# Patient Record
Sex: Male | Born: 1960 | Race: Black or African American | Hispanic: No
Health system: Southern US, Community
[De-identification: ages and names within clinical notes are randomized; demographics above are authoritative.]

---

## 2021-09-01 ENCOUNTER — Emergency Department (HOSPITAL_COMMUNITY): Payer: 59

## 2021-09-01 ENCOUNTER — Inpatient Hospital Stay (HOSPITAL_COMMUNITY): Payer: 59

## 2021-09-01 ENCOUNTER — Inpatient Hospital Stay (HOSPITAL_COMMUNITY)
Admission: EM | Admit: 2021-09-01 | Discharge: 2021-10-02 | DRG: 239 | Disposition: E | Payer: 59 | Attending: Pulmonary Disease | Admitting: Pulmonary Disease

## 2021-09-01 DIAGNOSIS — J9602 Acute respiratory failure with hypercapnia: Secondary | ICD-10-CM | POA: Diagnosis present

## 2021-09-01 DIAGNOSIS — I5021 Acute systolic (congestive) heart failure: Secondary | ICD-10-CM | POA: Diagnosis not present

## 2021-09-01 DIAGNOSIS — R509 Fever, unspecified: Secondary | ICD-10-CM

## 2021-09-01 DIAGNOSIS — I5023 Acute on chronic systolic (congestive) heart failure: Secondary | ICD-10-CM | POA: Diagnosis present

## 2021-09-01 DIAGNOSIS — Y901 Blood alcohol level of 20-39 mg/100 ml: Secondary | ICD-10-CM | POA: Diagnosis present

## 2021-09-01 DIAGNOSIS — G931 Anoxic brain damage, not elsewhere classified: Secondary | ICD-10-CM | POA: Diagnosis not present

## 2021-09-01 DIAGNOSIS — F1010 Alcohol abuse, uncomplicated: Secondary | ICD-10-CM | POA: Diagnosis present

## 2021-09-01 DIAGNOSIS — I110 Hypertensive heart disease with heart failure: Secondary | ICD-10-CM | POA: Diagnosis present

## 2021-09-01 DIAGNOSIS — D649 Anemia, unspecified: Secondary | ICD-10-CM | POA: Diagnosis present

## 2021-09-01 DIAGNOSIS — I4901 Ventricular fibrillation: Principal | ICD-10-CM | POA: Diagnosis present

## 2021-09-01 DIAGNOSIS — E669 Obesity, unspecified: Secondary | ICD-10-CM | POA: Diagnosis present

## 2021-09-01 DIAGNOSIS — J810 Acute pulmonary edema: Secondary | ICD-10-CM | POA: Diagnosis not present

## 2021-09-01 DIAGNOSIS — Z20822 Contact with and (suspected) exposure to covid-19: Secondary | ICD-10-CM | POA: Diagnosis present

## 2021-09-01 DIAGNOSIS — I70221 Atherosclerosis of native arteries of extremities with rest pain, right leg: Secondary | ICD-10-CM | POA: Diagnosis not present

## 2021-09-01 DIAGNOSIS — Z789 Other specified health status: Secondary | ICD-10-CM

## 2021-09-01 DIAGNOSIS — I214 Non-ST elevation (NSTEMI) myocardial infarction: Secondary | ICD-10-CM | POA: Diagnosis present

## 2021-09-01 DIAGNOSIS — R339 Retention of urine, unspecified: Secondary | ICD-10-CM | POA: Diagnosis not present

## 2021-09-01 DIAGNOSIS — R739 Hyperglycemia, unspecified: Secondary | ICD-10-CM | POA: Diagnosis not present

## 2021-09-01 DIAGNOSIS — I462 Cardiac arrest due to underlying cardiac condition: Secondary | ICD-10-CM | POA: Diagnosis present

## 2021-09-01 DIAGNOSIS — G9341 Metabolic encephalopathy: Secondary | ICD-10-CM | POA: Diagnosis present

## 2021-09-01 DIAGNOSIS — Z79899 Other long term (current) drug therapy: Secondary | ICD-10-CM

## 2021-09-01 DIAGNOSIS — R579 Shock, unspecified: Secondary | ICD-10-CM | POA: Diagnosis not present

## 2021-09-01 DIAGNOSIS — Y92410 Unspecified street and highway as the place of occurrence of the external cause: Secondary | ICD-10-CM

## 2021-09-01 DIAGNOSIS — J690 Pneumonitis due to inhalation of food and vomit: Secondary | ICD-10-CM | POA: Diagnosis not present

## 2021-09-01 DIAGNOSIS — K567 Ileus, unspecified: Secondary | ICD-10-CM | POA: Diagnosis not present

## 2021-09-01 DIAGNOSIS — J9601 Acute respiratory failure with hypoxia: Secondary | ICD-10-CM | POA: Diagnosis present

## 2021-09-01 DIAGNOSIS — M96A1 Fracture of sternum associated with chest compression and cardiopulmonary resuscitation: Secondary | ICD-10-CM | POA: Diagnosis present

## 2021-09-01 DIAGNOSIS — E872 Acidosis, unspecified: Secondary | ICD-10-CM | POA: Diagnosis present

## 2021-09-01 DIAGNOSIS — Y95 Nosocomial condition: Secondary | ICD-10-CM | POA: Diagnosis not present

## 2021-09-01 DIAGNOSIS — F17210 Nicotine dependence, cigarettes, uncomplicated: Secondary | ICD-10-CM | POA: Diagnosis present

## 2021-09-01 DIAGNOSIS — Z96643 Presence of artificial hip joint, bilateral: Secondary | ICD-10-CM | POA: Diagnosis present

## 2021-09-01 DIAGNOSIS — D696 Thrombocytopenia, unspecified: Secondary | ICD-10-CM | POA: Diagnosis present

## 2021-09-01 DIAGNOSIS — Z6833 Body mass index (BMI) 33.0-33.9, adult: Secondary | ICD-10-CM

## 2021-09-01 DIAGNOSIS — R4182 Altered mental status, unspecified: Secondary | ICD-10-CM | POA: Diagnosis not present

## 2021-09-01 DIAGNOSIS — I2510 Atherosclerotic heart disease of native coronary artery without angina pectoris: Secondary | ICD-10-CM | POA: Diagnosis not present

## 2021-09-01 DIAGNOSIS — Z515 Encounter for palliative care: Secondary | ICD-10-CM | POA: Diagnosis not present

## 2021-09-01 DIAGNOSIS — I513 Intracardiac thrombosis, not elsewhere classified: Secondary | ICD-10-CM | POA: Diagnosis present

## 2021-09-01 DIAGNOSIS — I743 Embolism and thrombosis of arteries of the lower extremities: Secondary | ICD-10-CM | POA: Diagnosis not present

## 2021-09-01 DIAGNOSIS — M96A3 Multiple fractures of ribs associated with chest compression and cardiopulmonary resuscitation: Secondary | ICD-10-CM | POA: Diagnosis present

## 2021-09-01 DIAGNOSIS — N170 Acute kidney failure with tubular necrosis: Secondary | ICD-10-CM | POA: Diagnosis not present

## 2021-09-01 DIAGNOSIS — E876 Hypokalemia: Secondary | ICD-10-CM | POA: Diagnosis not present

## 2021-09-01 DIAGNOSIS — Z452 Encounter for adjustment and management of vascular access device: Secondary | ICD-10-CM

## 2021-09-01 DIAGNOSIS — I255 Ischemic cardiomyopathy: Secondary | ICD-10-CM | POA: Diagnosis present

## 2021-09-01 DIAGNOSIS — F29 Unspecified psychosis not due to a substance or known physiological condition: Secondary | ICD-10-CM | POA: Diagnosis not present

## 2021-09-01 DIAGNOSIS — I428 Other cardiomyopathies: Secondary | ICD-10-CM | POA: Diagnosis present

## 2021-09-01 DIAGNOSIS — K219 Gastro-esophageal reflux disease without esophagitis: Secondary | ICD-10-CM | POA: Diagnosis present

## 2021-09-01 DIAGNOSIS — W2209XA Striking against other stationary object, initial encounter: Secondary | ICD-10-CM | POA: Diagnosis present

## 2021-09-01 DIAGNOSIS — R570 Cardiogenic shock: Secondary | ICD-10-CM | POA: Diagnosis not present

## 2021-09-01 DIAGNOSIS — F05 Delirium due to known physiological condition: Secondary | ICD-10-CM | POA: Diagnosis not present

## 2021-09-01 DIAGNOSIS — I493 Ventricular premature depolarization: Secondary | ICD-10-CM | POA: Diagnosis present

## 2021-09-01 DIAGNOSIS — J9690 Respiratory failure, unspecified, unspecified whether with hypoxia or hypercapnia: Secondary | ICD-10-CM

## 2021-09-01 DIAGNOSIS — S42025A Nondisplaced fracture of shaft of left clavicle, initial encounter for closed fracture: Secondary | ICD-10-CM | POA: Diagnosis present

## 2021-09-01 DIAGNOSIS — I469 Cardiac arrest, cause unspecified: Secondary | ICD-10-CM

## 2021-09-01 DIAGNOSIS — Z8673 Personal history of transient ischemic attack (TIA), and cerebral infarction without residual deficits: Secondary | ICD-10-CM

## 2021-09-01 DIAGNOSIS — T1490XA Injury, unspecified, initial encounter: Secondary | ICD-10-CM

## 2021-09-01 DIAGNOSIS — H5704 Mydriasis: Secondary | ICD-10-CM | POA: Diagnosis present

## 2021-09-01 DIAGNOSIS — S2249XA Multiple fractures of ribs, unspecified side, initial encounter for closed fracture: Secondary | ICD-10-CM

## 2021-09-01 DIAGNOSIS — I472 Ventricular tachycardia, unspecified: Secondary | ICD-10-CM | POA: Diagnosis present

## 2021-09-01 DIAGNOSIS — Z95828 Presence of other vascular implants and grafts: Secondary | ICD-10-CM

## 2021-09-01 DIAGNOSIS — Z978 Presence of other specified devices: Secondary | ICD-10-CM

## 2021-09-01 DIAGNOSIS — E875 Hyperkalemia: Secondary | ICD-10-CM | POA: Diagnosis present

## 2021-09-01 DIAGNOSIS — T148XXA Other injury of unspecified body region, initial encounter: Secondary | ICD-10-CM

## 2021-09-01 DIAGNOSIS — J189 Pneumonia, unspecified organism: Secondary | ICD-10-CM | POA: Diagnosis not present

## 2021-09-01 DIAGNOSIS — I5043 Acute on chronic combined systolic (congestive) and diastolic (congestive) heart failure: Secondary | ICD-10-CM | POA: Diagnosis not present

## 2021-09-01 DIAGNOSIS — Z9911 Dependence on respirator [ventilator] status: Secondary | ICD-10-CM | POA: Diagnosis not present

## 2021-09-01 DIAGNOSIS — Z781 Physical restraint status: Secondary | ICD-10-CM

## 2021-09-01 LAB — I-STAT ARTERIAL BLOOD GAS, ED
Acid-base deficit: 12 mmol/L — ABNORMAL HIGH (ref 0.0–2.0)
Acid-base deficit: 7 mmol/L — ABNORMAL HIGH (ref 0.0–2.0)
Bicarbonate: 20.6 mmol/L (ref 20.0–28.0)
Bicarbonate: 20.1 mmol/L (ref 20.0–28.0)
Calcium, Ion: 1.2 mmol/L (ref 1.15–1.40)
Calcium, Ion: 1.19 mmol/L (ref 1.15–1.40)
HCT: 53 % — ABNORMAL HIGH (ref 39.0–52.0)
HCT: 57 % — ABNORMAL HIGH (ref 39.0–52.0)
Hemoglobin: 18 g/dL — ABNORMAL HIGH (ref 13.0–17.0)
Hemoglobin: 19.4 g/dL — ABNORMAL HIGH (ref 13.0–17.0)
O2 Saturation: 82 %
O2 Saturation: 90 %
Patient temperature: 98.6
Potassium: 3.8 mmol/L (ref 3.5–5.1)
Potassium: 4.3 mmol/L (ref 3.5–5.1)
Sodium: 140 mmol/L (ref 135–145)
Sodium: 141 mmol/L (ref 135–145)
TCO2: 23 mmol/L (ref 22–32)
TCO2: 22 mmol/L (ref 22–32)
pCO2 arterial: 72.6 mmHg (ref 32.0–48.0)
pCO2 arterial: 45.5 mmHg (ref 32.0–48.0)
pH, Arterial: 7.062 — CL (ref 7.350–7.450)
pH, Arterial: 7.254 — ABNORMAL LOW (ref 7.350–7.450)
pO2, Arterial: 66 mmHg — ABNORMAL LOW (ref 83.0–108.0)
pO2, Arterial: 69 mmHg — ABNORMAL LOW (ref 83.0–108.0)

## 2021-09-01 LAB — COMPREHENSIVE METABOLIC PANEL
ALT: 43 U/L (ref 0–44)
AST: 74 U/L — ABNORMAL HIGH (ref 15–41)
Albumin: 3.5 g/dL (ref 3.5–5.0)
Alkaline Phosphatase: 99 U/L (ref 38–126)
Anion gap: 15 (ref 5–15)
BUN: 9 mg/dL (ref 8–23)
CO2: 19 mmol/L — ABNORMAL LOW (ref 22–32)
Calcium: 8.9 mg/dL (ref 8.9–10.3)
Chloride: 107 mmol/L (ref 98–111)
Creatinine, Ser: 1.51 mg/dL — ABNORMAL HIGH (ref 0.61–1.24)
GFR, Estimated: 52 mL/min — ABNORMAL LOW (ref >60)
Glucose, Bld: 198 mg/dL — ABNORMAL HIGH (ref 70–99)
Potassium: 3.1 mmol/L — ABNORMAL LOW (ref 3.5–5.1)
Sodium: 141 mmol/L (ref 135–145)
Total Bilirubin: 0.9 mg/dL (ref 0.3–1.2)
Total Protein: 7.4 g/dL (ref 6.5–8.1)

## 2021-09-01 LAB — URINALYSIS, ROUTINE W REFLEX MICROSCOPIC
Bilirubin Urine: NEGATIVE
Glucose, UA: 250 mg/dL — AB
Ketones, ur: NEGATIVE mg/dL
Leukocytes,Ua: NEGATIVE
Nitrite: NEGATIVE
Protein, ur: >300 mg/dL — AB
Specific Gravity, Urine: 1.02 (ref 1.005–1.030)
pH: 7.5 (ref 5.0–8.0)

## 2021-09-01 LAB — CBC
HCT: 55.1 % — ABNORMAL HIGH (ref 39.0–52.0)
Hemoglobin: 17.5 g/dL — ABNORMAL HIGH (ref 13.0–17.0)
MCH: 33.8 pg (ref 26.0–34.0)
MCHC: 31.8 g/dL (ref 30.0–36.0)
MCV: 106.6 fL — ABNORMAL HIGH (ref 80.0–100.0)
Platelets: 202 10*3/uL (ref 150–400)
RBC: 5.17 MIL/uL (ref 4.22–5.81)
RDW: 14.1 % (ref 11.5–15.5)
WBC: 17.1 10*3/uL — ABNORMAL HIGH (ref 4.0–10.5)
nRBC: 0.4 % — ABNORMAL HIGH (ref 0.0–0.2)

## 2021-09-01 LAB — URINALYSIS, MICROSCOPIC (REFLEX)

## 2021-09-01 LAB — LACTIC ACID, PLASMA
Lactic Acid, Venous: 8.5 mmol/L (ref 0.5–1.9)
Lactic Acid, Venous: 5 mmol/L (ref 0.5–1.9)
Lactic Acid, Venous: 5.1 mmol/L (ref 0.5–1.9)

## 2021-09-01 LAB — CBG MONITORING, ED
Glucose-Capillary: 203 mg/dL — ABNORMAL HIGH (ref 70–99)
Glucose-Capillary: 122 mg/dL — ABNORMAL HIGH (ref 70–99)

## 2021-09-01 LAB — POCT I-STAT 7, (LYTES, BLD GAS, ICA,H+H)
Acid-base deficit: 7 mmol/L — ABNORMAL HIGH (ref 0.0–2.0)
Bicarbonate: 18 mmol/L — ABNORMAL LOW (ref 20.0–28.0)
Calcium, Ion: 1.11 mmol/L — ABNORMAL LOW (ref 1.15–1.40)
HCT: 56 % — ABNORMAL HIGH (ref 39.0–52.0)
Hemoglobin: 19 g/dL — ABNORMAL HIGH (ref 13.0–17.0)
O2 Saturation: 99 %
Patient temperature: 39.6
Potassium: 4.5 mmol/L (ref 3.5–5.1)
Sodium: 141 mmol/L (ref 135–145)
TCO2: 19 mmol/L — ABNORMAL LOW (ref 22–32)
pCO2 arterial: 37.3 mmHg (ref 32.0–48.0)
pH, Arterial: 7.303 — ABNORMAL LOW (ref 7.350–7.450)
pO2, Arterial: 162 mmHg — ABNORMAL HIGH (ref 83.0–108.0)

## 2021-09-01 LAB — PROCALCITONIN: Procalcitonin: 3.08 ng/mL

## 2021-09-01 LAB — GLUCOSE, CAPILLARY
Glucose-Capillary: 88 mg/dL (ref 70–99)
Glucose-Capillary: 82 mg/dL (ref 70–99)
Glucose-Capillary: 80 mg/dL (ref 70–99)

## 2021-09-01 LAB — I-STAT CHEM 8, ED
BUN: 14 mg/dL (ref 8–23)
Calcium, Ion: 1.09 mmol/L — ABNORMAL LOW (ref 1.15–1.40)
Chloride: 106 mmol/L (ref 98–111)
Creatinine, Ser: 1.4 mg/dL — ABNORMAL HIGH (ref 0.61–1.24)
Glucose, Bld: 193 mg/dL — ABNORMAL HIGH (ref 70–99)
HCT: 57 % — ABNORMAL HIGH (ref 39.0–52.0)
Hemoglobin: 19.4 g/dL — ABNORMAL HIGH (ref 13.0–17.0)
Potassium: 3.5 mmol/L (ref 3.5–5.1)
Sodium: 143 mmol/L (ref 135–145)
TCO2: 23 mmol/L (ref 22–32)

## 2021-09-01 LAB — SAMPLE TO BLOOD BANK

## 2021-09-01 LAB — RESP PANEL BY RT-PCR (FLU A&B, COVID) ARPGX2
Influenza A by PCR: NEGATIVE
Influenza B by PCR: NEGATIVE
SARS Coronavirus 2 by RT PCR: NEGATIVE

## 2021-09-01 LAB — TROPONIN I (HIGH SENSITIVITY)
Troponin I (High Sensitivity): 129 ng/L (ref <18)
Troponin I (High Sensitivity): 2835 ng/L (ref <18)

## 2021-09-01 LAB — APTT: aPTT: 25 seconds (ref 24–36)

## 2021-09-01 LAB — PROTIME-INR
INR: 1.1 (ref 0.8–1.2)
INR: 1.1 (ref 0.8–1.2)
Prothrombin Time: 13.8 seconds (ref 11.4–15.2)
Prothrombin Time: 13.9 seconds (ref 11.4–15.2)

## 2021-09-01 LAB — ETHANOL: Alcohol, Ethyl (B): 32 mg/dL — ABNORMAL HIGH (ref <10)

## 2021-09-01 LAB — MRSA NEXT GEN BY PCR, NASAL: MRSA by PCR Next Gen: NOT DETECTED

## 2021-09-01 LAB — CORTISOL: Cortisol, Plasma: 17.7 ug/dL

## 2021-09-01 LAB — HEMOGLOBIN A1C
Hgb A1c MFr Bld: 5.3 % (ref 4.8–5.6)
Mean Plasma Glucose: 105.41 mg/dL

## 2021-09-01 LAB — HIV ANTIBODY (ROUTINE TESTING W REFLEX): HIV Screen 4th Generation wRfx: NONREACTIVE

## 2021-09-01 MED ORDER — ROCURONIUM BROMIDE 50 MG/5ML IV SOLN
INTRAVENOUS | Status: DC | PRN
  Administered 2021-09-01: 80 mg via INTRAVENOUS

## 2021-09-01 MED ORDER — CHLORHEXIDINE GLUCONATE CLOTH 2 % EX PADS
6.0000 | MEDICATED_PAD | Freq: Every day | CUTANEOUS | Status: DC
  Administered 2021-09-02 – 2021-09-14 (×12): 6 via TOPICAL

## 2021-09-01 MED ORDER — PANTOPRAZOLE SODIUM 40 MG IV SOLR: 40.0000 mg | Freq: Every day | INTRAVENOUS | Status: DC

## 2021-09-01 MED ORDER — PANTOPRAZOLE SODIUM 40 MG IV SOLR
40.0000 mg | Freq: Every day | INTRAVENOUS | Status: DC
  Administered 2021-09-01: 40 mg via INTRAVENOUS
  Filled 2021-09-01: qty 40

## 2021-09-01 MED ORDER — SODIUM CHLORIDE 0.9 % IV SOLN
250.0000 mL | INTRAVENOUS | Status: DC
  Administered 2021-09-11: 250 mL via INTRAVENOUS

## 2021-09-01 MED ORDER — FENTANYL 2500MCG IN NS 250ML (10MCG/ML) PREMIX INFUSION
0.0000 ug/h | INTRAVENOUS | Status: DC
  Administered 2021-09-01: 16:00:00 50 ug/h via INTRAVENOUS
  Administered 2021-09-02: 22:00:00 150 ug/h via INTRAVENOUS
  Administered 2021-09-03: 100 ug/h via INTRAVENOUS
  Administered 2021-09-04: 150 ug/h via INTRAVENOUS
  Administered 2021-09-05: 11:00:00 75 ug/h via INTRAVENOUS
  Administered 2021-09-06: 100 ug/h via INTRAVENOUS
  Administered 2021-09-07: 200 ug/h via INTRAVENOUS
  Administered 2021-09-07: 150 ug/h via INTRAVENOUS
  Administered 2021-09-08: 225 ug/h via INTRAVENOUS
  Administered 2021-09-08: 13:00:00 275 ug/h via INTRAVENOUS
  Administered 2021-09-09: 03:00:00 150 ug/h via INTRAVENOUS
  Administered 2021-09-09 – 2021-09-11 (×4): 200 ug/h via INTRAVENOUS
  Administered 2021-09-11: 14:00:00 100 ug/h via INTRAVENOUS
  Administered 2021-09-12: 50 ug/h via INTRAVENOUS
  Administered 2021-09-14: 09:00:00 400 ug/h via INTRAVENOUS
  Filled 2021-09-01 (×18): qty 250

## 2021-09-01 MED ORDER — SODIUM CHLORIDE 0.9 % IV SOLN: INTRAVENOUS | Status: DC

## 2021-09-01 MED ORDER — NOREPINEPHRINE 4 MG/250ML-% IV SOLN
0.0000 ug/min | INTRAVENOUS | Status: DC
  Administered 2021-09-01: 20:00:00 16 ug/min via INTRAVENOUS
  Administered 2021-09-01: 30 ug/min via INTRAVENOUS
  Administered 2021-09-02: 20:00:00 21 ug/min via INTRAVENOUS
  Administered 2021-09-02: 07:00:00 22 ug/min via INTRAVENOUS
  Administered 2021-09-02: 20 ug/min via INTRAVENOUS
  Administered 2021-09-02: 24 ug/min via INTRAVENOUS
  Administered 2021-09-02: 26 ug/min via INTRAVENOUS
  Administered 2021-09-03 (×2): 20 ug/min via INTRAVENOUS
  Administered 2021-09-03: 18 ug/min via INTRAVENOUS
  Administered 2021-09-03: 12 ug/min via INTRAVENOUS
  Administered 2021-09-03: 03:00:00 20 ug/min via INTRAVENOUS
  Administered 2021-09-03: 16:00:00 17 ug/min via INTRAVENOUS
  Administered 2021-09-04: 03:00:00 8 ug/min via INTRAVENOUS
  Filled 2021-09-01 (×16): qty 250

## 2021-09-01 MED ORDER — DOCUSATE SODIUM 50 MG/5ML PO LIQD
100.0000 mg | Freq: Two times a day (BID) | ORAL | Status: DC | PRN
  Filled 2021-09-01: qty 10

## 2021-09-01 MED ORDER — ETOMIDATE 2 MG/ML IV SOLN
INTRAVENOUS | Status: DC | PRN
  Administered 2021-09-01: 20 mg via INTRAVENOUS

## 2021-09-01 MED ORDER — ACETAMINOPHEN 650 MG RE SUPP
650.0000 mg | RECTAL | Status: DC | PRN
  Administered 2021-09-01: 650 mg via RECTAL

## 2021-09-01 MED ORDER — EPINEPHRINE 1 MG/10ML IJ SOSY
PREFILLED_SYRINGE | INTRAMUSCULAR | Status: DC | PRN
  Administered 2021-09-01: 1 mg via INTRAVENOUS

## 2021-09-01 MED ORDER — ALBUTEROL SULFATE (2.5 MG/3ML) 0.083% IN NEBU
2.5000 mg | INHALATION_SOLUTION | RESPIRATORY_TRACT | Status: DC | PRN
  Administered 2021-09-08: 2.5 mg via RESPIRATORY_TRACT
  Filled 2021-09-01 (×2): qty 3

## 2021-09-01 MED ORDER — INSULIN ASPART 100 UNIT/ML IJ SOLN
0.0000 [IU] | INTRAMUSCULAR | Status: DC
  Administered 2021-09-01 – 2021-09-09 (×22): 2 [IU] via SUBCUTANEOUS
  Administered 2021-09-09: 3 [IU] via SUBCUTANEOUS
  Administered 2021-09-10 – 2021-09-13 (×9): 2 [IU] via SUBCUTANEOUS

## 2021-09-01 MED ORDER — SODIUM BICARBONATE 8.4 % IV SOLN
50.0000 meq | Freq: Once | INTRAVENOUS | Status: AC
  Administered 2021-09-01: 50 meq via INTRAVENOUS

## 2021-09-01 MED ORDER — ORAL CARE MOUTH RINSE
15.0000 mL | OROMUCOSAL | Status: DC
  Administered 2021-09-01 – 2021-09-13 (×118): 15 mL via OROMUCOSAL

## 2021-09-01 MED ORDER — POLYETHYLENE GLYCOL 3350 17 G PO PACK: 17.0000 g | PACK | Freq: Every day | ORAL | Status: DC | PRN

## 2021-09-01 MED ORDER — CHLORHEXIDINE GLUCONATE 0.12% ORAL RINSE (MEDLINE KIT)
15.0000 mL | Freq: Two times a day (BID) | OROMUCOSAL | Status: DC
  Administered 2021-09-01 – 2021-09-13 (×24): 15 mL via OROMUCOSAL

## 2021-09-01 MED ORDER — PHENYLEPHRINE 40 MCG/ML (10ML) SYRINGE FOR IV PUSH (FOR BLOOD PRESSURE SUPPORT)
PREFILLED_SYRINGE | INTRAVENOUS | Status: AC
  Administered 2021-09-01: 400 ug
  Filled 2021-09-01: qty 10

## 2021-09-01 MED ORDER — SODIUM CHLORIDE 0.9 % IV BOLUS
1000.0000 mL | Freq: Once | INTRAVENOUS | Status: AC
  Administered 2021-09-01: 1000 mL via INTRAVENOUS

## 2021-09-01 MED ORDER — IOHEXOL 350 MG/ML SOLN
100.0000 mL | Freq: Once | INTRAVENOUS | Status: AC | PRN
  Administered 2021-09-01: 100 mL via INTRAVENOUS

## 2021-09-01 MED ORDER — NOREPINEPHRINE 4 MG/250ML-% IV SOLN
2.0000 ug/min | INTRAVENOUS | Status: DC
  Administered 2021-09-01: 16 ug/min via INTRAVENOUS

## 2021-09-01 MED ORDER — SODIUM CHLORIDE 0.9 % IV SOLN
3.0000 g | Freq: Once | INTRAVENOUS | Status: AC
  Administered 2021-09-01: 3 g via INTRAVENOUS
  Filled 2021-09-01: qty 8

## 2021-09-01 MED ORDER — SODIUM CHLORIDE 0.9 % IV SOLN
3.0000 g | Freq: Four times a day (QID) | INTRAVENOUS | Status: AC
  Administered 2021-09-01 – 2021-09-05 (×18): 3 g via INTRAVENOUS
  Filled 2021-09-01 (×19): qty 8

## 2021-09-01 MED ORDER — PROPOFOL 1000 MG/100ML IV EMUL
5.0000 ug/kg/min | INTRAVENOUS | Status: DC
  Administered 2021-09-01: 30 ug/kg/min via INTRAVENOUS
  Administered 2021-09-01: 40 ug/kg/min via INTRAVENOUS
  Administered 2021-09-01: 10 ug/kg/min via INTRAVENOUS
  Administered 2021-09-02: 40 ug/kg/min via INTRAVENOUS
  Administered 2021-09-02: 20 ug/kg/min via INTRAVENOUS
  Administered 2021-09-02: 30 ug/kg/min via INTRAVENOUS
  Administered 2021-09-02: 40 ug/kg/min via INTRAVENOUS
  Filled 2021-09-01 (×7): qty 100

## 2021-09-01 MED ORDER — METOPROLOL TARTRATE 5 MG/5ML IV SOLN
2.5000 mg | Freq: Four times a day (QID) | INTRAVENOUS | Status: DC
Start: 2021-09-01 — End: 2021-09-02
  Administered 2021-09-01: 5 mg via INTRAVENOUS
  Filled 2021-09-01: qty 5

## 2021-09-01 MED ORDER — NOREPINEPHRINE 4 MG/250ML-% IV SOLN
INTRAVENOUS | Status: AC
  Filled 2021-09-01: qty 250

## 2021-09-01 MED ORDER — DOCUSATE SODIUM 100 MG PO CAPS: 100.0000 mg | ORAL_CAPSULE | Freq: Two times a day (BID) | ORAL | Status: DC | PRN

## 2021-09-01 MED ORDER — SODIUM BICARBONATE 8.4 % IV SOLN
INTRAVENOUS | Status: AC
  Filled 2021-09-01: qty 50
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
## 2021-09-01 NOTE — Progress Notes (Addendum)
Called to bedside  Hypotension with systolic in the 50s  Received bolus of saline Amp of bicarb Neo-Synephrine 200 Started on Levophed  Central line placed in right IJ  Placed order for echocardiogram stat to rule out thrombus, amended earlier order   No seizures on EEG, diffuse encephalopathy.  Additional critical care time spent evaluating and stabilizing patient

## 2021-09-01 NOTE — ED Notes (Signed)
C-collar placed.

## 2021-09-01 NOTE — Procedures (Signed)
Central Venous Catheter Insertion Procedure Note  AVERILL Castaneda  [id number]  September 25, 1961  Date:09/05/2021  Time:6:02 PM   Provider Performing:Wladyslawa Disbro A Onia Shiflett   Procedure: Insertion of Non-tunneled Central Venous Catheter(36556) with US guidance (92119)   Indication(s) Medication administration  Consent Unable to obtain consent due to emergent nature of procedure.  Anesthesia Topical only with 1% lidocaine   Timeout Verified patient identification, verified procedure, site/side was marked, verified correct patient position, special equipment/implants available, medications/allergies/relevant history reviewed, required imaging and test results available.  Sterile Technique Maximal sterile technique including full sterile barrier drape, hand hygiene, sterile gown, sterile gloves, mask, hair covering, sterile ultrasound probe cover (if used).  Procedure Description Area of catheter insertion was cleaned with chlorhexidine and draped in sterile fashion.  With real-time ultrasound guidance a central venous catheter was placed into the right internal jugular vein. Nonpulsatile blood flow and easy flushing noted in all ports.  The catheter was sutured in place and sterile dressing applied.  Complications/Tolerance None; patient tolerated the procedure well. Chest X-ray is ordered to verify placement for internal jugular or subclavian cannulation.   Chest x-ray is not ordered for femoral cannulation.  EBL Minimal  Specimen(s) None

## 2021-09-01 NOTE — ED Notes (Signed)
Patient transported to CT 
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
## 2021-09-01 NOTE — Progress Notes (Signed)
Orthopedic Tech Progress Note Patient Details:  Frederick Castaneda Dec 29, 1960 [id number]  Ortho Devices Type of Ortho Device: Shoulder immobilizer Ortho Device/Splint Location: left Ortho Device/Splint Interventions: Ordered, Application, Adjustment      Frederick Castaneda 09/17/2021, 4:41 PM Applied sling
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
## 2021-09-01 NOTE — Progress Notes (Signed)
All belongings previously mentioned given to and taken home by brother Chan Sheahan.

## 2021-09-01 NOTE — Progress Notes (Signed)
Pt belongings include: Presenter, broadcasting (drivers license, [employer], [employer], Lindie Spruce, food [employer] card, EBT card, UHC card x2,[employer] debit card, [place] debit card, [employer] card, united way card)  5 x $1 $2 quarters $0.10 dimes $0.20 nickels $0.06 pennies
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
## 2021-09-01 NOTE — TOC CAGE-AID Note (Signed)
Transition of Care [facility]) - CAGE-AID Screening   Patient Details  Name: Frederick Castaneda MRN: [id number] Date of Birth: 06-27-1961  Transition of Care [facility]) CM/SW Contact:    Secret Kristensen C Tarpley-Carter, LCSWA Phone Number: 09/28/2021, 1:27 PM   Clinical Narrative: Pt is unable to participate in Cage Aid.  Deshundra Waller Tarpley-Carter, MSW, LCSW-A Pronouns:  She/Her/Hers [facility] HealthTransitions of Care Clinical Social Worker Direct Number:  [phone] [email or web address]    CAGE-AID Screening: Substance Abuse Screening unable to be completed due to: : Patient unable to participate             Substance Abuse Education Offered: No

## 2021-09-01 NOTE — Procedures (Signed)
Patient Name: Frederick Castaneda  MRN: [id number]  Epilepsy Attending: Charlsie Quest  Referring Physician/Provider: Oscar La, NP Date: 09/22/2021 Duration: 25.05 minutes  Patient history: 60 year old male status post cardiac arrest.  EEG to evaluate for seizure.  Level of alertness: comatose  AEDs during EEG study: Propofol  Technical aspects: This EEG study was done with scalp electrodes positioned according to the 10-20 International system of electrode placement. Electrical activity was acquired at a sampling rate of 500Hz  and reviewed with a high frequency filter of 70Hz  and a low frequency filter of 1Hz . EEG data were recorded continuously and digitally stored.   Description: EEG showed intermittent generalized 3 to 5 Hz theta-delta slowing as well as near continuous generalized background attenuation. Hyperventilation and photic stimulation were not performed.     ABNORMALITY - Intermittent slow, generalized - Background attenuation, generalized  IMPRESSION: This study is  suggestive of profound diffuse encephalopathy, nonspecific etiology. No seizures or epileptiform discharges were seen throughout the recording.  Nigel Wessman 
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
## 2021-09-01 NOTE — Progress Notes (Signed)
Assisted MD with intubation.  7.5 ETT placed 26 lip, BBS, good color change ETCO2 det.placed on vent 8CC VT RR inc 22, Peep 14(per MD)100% FIO2. Transported pt to CT with RN at bedside.  Pt tolerated well

## 2021-09-01 NOTE — Progress Notes (Signed)
EEG complete - results pending 

## 2021-09-02 ENCOUNTER — Inpatient Hospital Stay (HOSPITAL_COMMUNITY): Payer: 59

## 2021-09-02 DIAGNOSIS — R579 Shock, unspecified: Secondary | ICD-10-CM

## 2021-09-02 DIAGNOSIS — I469 Cardiac arrest, cause unspecified: Secondary | ICD-10-CM | POA: Diagnosis not present

## 2021-09-02 DIAGNOSIS — R570 Cardiogenic shock: Secondary | ICD-10-CM | POA: Diagnosis not present

## 2021-09-02 DIAGNOSIS — I513 Intracardiac thrombosis, not elsewhere classified: Secondary | ICD-10-CM | POA: Diagnosis not present

## 2021-09-02 DIAGNOSIS — I5021 Acute systolic (congestive) heart failure: Secondary | ICD-10-CM

## 2021-09-02 LAB — COOXEMETRY PANEL
Carboxyhemoglobin: 0.9 % (ref 0.5–1.5)
Methemoglobin: 0.8 % (ref 0.0–1.5)
O2 Saturation: 67.4 %
Total hemoglobin: 16.1 g/dL — ABNORMAL HIGH (ref 12.0–16.0)

## 2021-09-02 LAB — COMPREHENSIVE METABOLIC PANEL
ALT: 30 U/L (ref 0–44)
AST: 42 U/L — ABNORMAL HIGH (ref 15–41)
Albumin: 2.6 g/dL — ABNORMAL LOW (ref 3.5–5.0)
Alkaline Phosphatase: 61 U/L (ref 38–126)
Anion gap: 7 (ref 5–15)
BUN: 23 mg/dL (ref 8–23)
CO2: 21 mmol/L — ABNORMAL LOW (ref 22–32)
Calcium: 7.4 mg/dL — ABNORMAL LOW (ref 8.9–10.3)
Chloride: 107 mmol/L (ref 98–111)
Creatinine, Ser: 1.9 mg/dL — ABNORMAL HIGH (ref 0.61–1.24)
GFR, Estimated: 40 mL/min — ABNORMAL LOW (ref >60)
Glucose, Bld: 165 mg/dL — ABNORMAL HIGH (ref 70–99)
Potassium: 5.7 mmol/L — ABNORMAL HIGH (ref 3.5–5.1)
Sodium: 135 mmol/L (ref 135–145)
Total Bilirubin: 1.1 mg/dL (ref 0.3–1.2)
Total Protein: 5.9 g/dL — ABNORMAL LOW (ref 6.5–8.1)

## 2021-09-02 LAB — CBC
HCT: 51 % (ref 39.0–52.0)
Hemoglobin: 16.6 g/dL (ref 13.0–17.0)
MCH: 33.8 pg (ref 26.0–34.0)
MCHC: 32.5 g/dL (ref 30.0–36.0)
MCV: 103.9 fL — ABNORMAL HIGH (ref 80.0–100.0)
Platelets: 170 10*3/uL (ref 150–400)
RBC: 4.91 MIL/uL (ref 4.22–5.81)
RDW: 14.4 % (ref 11.5–15.5)
WBC: 19 10*3/uL — ABNORMAL HIGH (ref 4.0–10.5)
nRBC: 0.2 % (ref 0.0–0.2)

## 2021-09-02 LAB — TRIGLYCERIDES: Triglycerides: 214 mg/dL — ABNORMAL HIGH (ref <150)

## 2021-09-02 LAB — POTASSIUM: Potassium: 5.1 mmol/L (ref 3.5–5.1)

## 2021-09-02 LAB — LACTIC ACID, PLASMA
Lactic Acid, Venous: 2.6 mmol/L (ref 0.5–1.9)
Lactic Acid, Venous: 2.2 mmol/L (ref 0.5–1.9)

## 2021-09-02 LAB — ECHOCARDIOGRAM COMPLETE
AR max vel: 1.93 cm2
AV Area VTI: 2.05 cm2
AV Area mean vel: 1.8 cm2
AV Mean grad: 3 mmHg
AV Peak grad: 6.4 mmHg
Ao pk vel: 1.26 m/s
Area-P 1/2: 3.65 cm2
Height: 72 in
S' Lateral: 4.2 cm
Weight: 2800.72 oz

## 2021-09-02 LAB — POCT I-STAT 7, (LYTES, BLD GAS, ICA,H+H)
Acid-base deficit: 8 mmol/L — ABNORMAL HIGH (ref 0.0–2.0)
Bicarbonate: 17.9 mmol/L — ABNORMAL LOW (ref 20.0–28.0)
Calcium, Ion: 1.13 mmol/L — ABNORMAL LOW (ref 1.15–1.40)
HCT: 53 % — ABNORMAL HIGH (ref 39.0–52.0)
Hemoglobin: 18 g/dL — ABNORMAL HIGH (ref 13.0–17.0)
O2 Saturation: 97 %
Patient temperature: 98
Potassium: 4.5 mmol/L (ref 3.5–5.1)
Sodium: 139 mmol/L (ref 135–145)
TCO2: 19 mmol/L — ABNORMAL LOW (ref 22–32)
pCO2 arterial: 35.6 mmHg (ref 32.0–48.0)
pH, Arterial: 7.308 — ABNORMAL LOW (ref 7.350–7.450)
pO2, Arterial: 101 mmHg (ref 83.0–108.0)

## 2021-09-02 LAB — BASIC METABOLIC PANEL
Anion gap: 10 (ref 5–15)
BUN: 22 mg/dL (ref 8–23)
CO2: 23 mmol/L (ref 22–32)
Calcium: 8 mg/dL — ABNORMAL LOW (ref 8.9–10.3)
Chloride: 106 mmol/L (ref 98–111)
Creatinine, Ser: 2.43 mg/dL — ABNORMAL HIGH (ref 0.61–1.24)
GFR, Estimated: 30 mL/min — ABNORMAL LOW (ref >60)
Glucose, Bld: 165 mg/dL — ABNORMAL HIGH (ref 70–99)
Potassium: 4.2 mmol/L (ref 3.5–5.1)
Sodium: 139 mmol/L (ref 135–145)

## 2021-09-02 LAB — PHOSPHORUS
Phosphorus: 2.9 mg/dL (ref 2.5–4.6)
Phosphorus: 4.1 mg/dL (ref 2.5–4.6)

## 2021-09-02 LAB — TROPONIN I (HIGH SENSITIVITY)
Troponin I (High Sensitivity): 485 ng/L (ref <18)
Troponin I (High Sensitivity): 394 ng/L (ref <18)

## 2021-09-02 LAB — MAGNESIUM
Magnesium: 1.3 mg/dL — ABNORMAL LOW (ref 1.7–2.4)
Magnesium: 2.7 mg/dL — ABNORMAL HIGH (ref 1.7–2.4)

## 2021-09-02 LAB — HEPARIN LEVEL (UNFRACTIONATED): Heparin Unfractionated: <0.1 IU/mL — ABNORMAL LOW (ref 0.30–0.70)

## 2021-09-02 LAB — GLUCOSE, CAPILLARY
Glucose-Capillary: 131 mg/dL — ABNORMAL HIGH (ref 70–99)
Glucose-Capillary: 107 mg/dL — ABNORMAL HIGH (ref 70–99)
Glucose-Capillary: 118 mg/dL — ABNORMAL HIGH (ref 70–99)
Glucose-Capillary: 134 mg/dL — ABNORMAL HIGH (ref 70–99)
Glucose-Capillary: 134 mg/dL — ABNORMAL HIGH (ref 70–99)

## 2021-09-02 MED ORDER — PERFLUTREN LIPID MICROSPHERE
1.0000 mL | INTRAVENOUS | Status: AC | PRN
Start: 2021-09-02 — End: 2021-09-02
  Administered 2021-09-02: 2 mL via INTRAVENOUS
  Filled 2021-09-02: qty 10

## 2021-09-02 MED ORDER — VITAL HIGH PROTEIN PO LIQD
1000.0000 mL | ORAL | Status: DC
  Administered 2021-09-02: 1000 mL

## 2021-09-02 MED ORDER — ACETAMINOPHEN 325 MG PO TABS
650.0000 mg | ORAL_TABLET | Freq: Four times a day (QID) | ORAL | Status: DC | PRN
  Administered 2021-09-03: 650 mg
  Filled 2021-09-02: qty 2

## 2021-09-02 MED ORDER — VITAL 1.5 CAL PO LIQD
1000.0000 mL | ORAL | Status: DC
  Administered 2021-09-02: 1000 mL

## 2021-09-02 MED ORDER — PANTOPRAZOLE 2 MG/ML SUSPENSION
40.0000 mg | Freq: Every day | ORAL | Status: DC
Start: 2021-09-02 — End: 2021-09-13
  Administered 2021-09-02 – 2021-09-13 (×11): 40 mg
  Filled 2021-09-02 (×12): qty 20

## 2021-09-02 MED ORDER — HEPARIN BOLUS VIA INFUSION
4000.0000 [IU] | Freq: Once | INTRAVENOUS | Status: AC
  Administered 2021-09-02: 4000 [IU] via INTRAVENOUS
  Filled 2021-09-02: qty 4000

## 2021-09-02 MED ORDER — PROSOURCE TF PO LIQD
45.0000 mL | Freq: Two times a day (BID) | ORAL | Status: DC
  Administered 2021-09-02: 45 mL
  Filled 2021-09-02: qty 45

## 2021-09-02 MED ORDER — ACETAMINOPHEN 650 MG RE SUPP: 650.0000 mg | RECTAL | Status: DC | PRN

## 2021-09-02 MED ORDER — PROSOURCE TF PO LIQD
90.0000 mL | Freq: Two times a day (BID) | ORAL | Status: DC
  Administered 2021-09-02 – 2021-09-03 (×3): 90 mL
  Filled 2021-09-02 (×3): qty 90

## 2021-09-02 MED ORDER — HEPARIN (PORCINE) 25000 UT/250ML-% IV SOLN
1200.0000 [IU]/h | INTRAVENOUS | Status: DC
  Administered 2021-09-02: 950 [IU]/h via INTRAVENOUS
  Filled 2021-09-02: qty 250

## 2021-09-02 MED ORDER — VITAL HIGH PROTEIN PO LIQD
1000.0000 mL | ORAL | Status: AC
  Administered 2021-09-02: 1000 mL

## 2021-09-02 MED ORDER — VITAL HIGH PROTEIN PO LIQD: 1000.0000 mL | ORAL | Status: DC

## 2021-09-02 MED ORDER — MAGNESIUM SULFATE 4 GM/100ML IV SOLN
4.0000 g | Freq: Once | INTRAVENOUS | Status: AC
  Administered 2021-09-02: 4 g via INTRAVENOUS
  Filled 2021-09-02: qty 100
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
## 2021-09-02 NOTE — Progress Notes (Signed)
  Echocardiogram 2D Echocardiogram with contrast has been performed.  Frederick Castaneda F 09/02/2021, 8:58 AM

## 2021-09-02 NOTE — Plan of Care (Addendum)
Patient remains in CVICU intubated, on vasopressors.  0150 hrs: Paged on-call arterial/vascular pager per instructions of on-site [place] tech [phone]) regarding order for STAT US RLE; awaiting response.   0330 hrs: Patient transported off unit to OR for emergent thrombectomy by Dr. Edilia Bo. Transport by this Charity fundraiser, Scientist, clinical (histocompatibility and immunogenetics), and OR RN.   Problem: Education: Goal: Knowledge of General Education information will improve Description: Including pain rating scale, medication(s)/side effects and non-pharmacologic comfort measures Outcome: Progressing   Problem: Health Behavior/Discharge Planning: Goal: Ability to manage health-related needs will improve Outcome: Progressing   Problem: Clinical Measurements: Goal: Ability to maintain clinical measurements within normal limits will improve Outcome: Progressing Goal: Will remain free from infection Outcome: Progressing Goal: Diagnostic test results will improve Outcome: Progressing Goal: Respiratory complications will improve Outcome: Progressing Goal: Cardiovascular complication will be avoided Outcome: Progressing   Problem: Activity: Goal: Risk for activity intolerance will decrease Outcome: Progressing   Problem: Nutrition: Goal: Adequate nutrition will be maintained Outcome: Progressing   Problem: Coping: Goal: Level of anxiety will decrease Outcome: Progressing   Problem: Elimination: Goal: Will not experience complications related to bowel motility Outcome: Progressing Goal: Will not experience complications related to urinary retention Outcome: Progressing   Problem: Pain Managment: Goal: General experience of comfort will improve Outcome: Progressing   Problem: Safety: Goal: Ability to remain free from injury will improve Outcome: Progressing   Problem: Skin Integrity: Goal: Risk for impaired skin integrity will decrease Outcome: Progressing

## 2021-09-02 NOTE — Progress Notes (Signed)
Pt transported to 2H01 from 4N17 without any complications.
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
## 2021-09-02 NOTE — Progress Notes (Signed)
Spoke with pt's brother and provided update about current status and treatment plan.  Coralyn Helling, MD [facility] Pulmonary/Critical Care Pager - [phone] 09/02/2021, 5:35 PM
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
## 2021-09-02 NOTE — Progress Notes (Signed)
On initial assessment of pt, upper extremity movement to pain and sluggish pupil response noted. Propofol and Fentanyl dosage at 40 and 100 respectively. At approximately,1545 upon reassessment, pt pupils were fixed, pinpoint, with absent dolls eyes and no response to pain, or gag. Propofol titrated down to 20 at this time. CCM attending paged and received order for stat CT. Advanced Heart Failure came to bedside, and communicated assessment findings to them as well. Received verbal order to hold Heparin gtt at this time. Titrated off propofol; pt demonstrated flexion bilaterally of upper extremities and lateral movement of lower extremities. Pupils still fixed with no doll's eyes. Communicated findings to MD at bedside, confirmed need for stat CT. Pt demonstrated agitation on trip to CT needing increasing sedation requirements, vitals remained stable. On arrival back to unit, O2 sats dropped to high 80s with RT at bedside. Pressor requirements increased as sedation titrated down. Sats improved to high 90s with increase in Fio2 to 100% by RT. CT returned negative, paged Cardiology and received order to resume Heparin.

## 2021-09-02 NOTE — Progress Notes (Addendum)
Notifying MD re:  fio2 increase requirements s/p CT scan.  Waiting for MD response.  RN aware.   Notified oncoming night shift RT of above.  No new RT orders received from MD.

## 2021-09-02 NOTE — Progress Notes (Signed)
Pt transported to/from CT w/ no apparent respiratory complications.  Once back in ICU, noted sats 87-89%.  Gave 100% fio2 boost and sx pt.  Sats continued to trend 87-89%.  Changed probe Pulse ox monitor site, sat still reading 88-89%.  Pt placed on 100%, after several minutes, sat improved to 98-100%.  RN in room and aware.

## 2021-09-03 ENCOUNTER — Encounter (HOSPITAL_COMMUNITY): Admission: EM | Disposition: E | Payer: Self-pay | Source: Home / Self Care | Attending: Critical Care Medicine

## 2021-09-03 ENCOUNTER — Inpatient Hospital Stay (HOSPITAL_COMMUNITY): Payer: 59 | Admitting: Certified Registered Nurse Anesthetist

## 2021-09-03 ENCOUNTER — Inpatient Hospital Stay (HOSPITAL_COMMUNITY): Payer: 59

## 2021-09-03 DIAGNOSIS — R570 Cardiogenic shock: Secondary | ICD-10-CM | POA: Diagnosis not present

## 2021-09-03 DIAGNOSIS — J9602 Acute respiratory failure with hypercapnia: Secondary | ICD-10-CM

## 2021-09-03 DIAGNOSIS — I70221 Atherosclerosis of native arteries of extremities with rest pain, right leg: Secondary | ICD-10-CM

## 2021-09-03 DIAGNOSIS — I469 Cardiac arrest, cause unspecified: Secondary | ICD-10-CM | POA: Diagnosis not present

## 2021-09-03 DIAGNOSIS — I743 Embolism and thrombosis of arteries of the lower extremities: Secondary | ICD-10-CM

## 2021-09-03 DIAGNOSIS — J9601 Acute respiratory failure with hypoxia: Secondary | ICD-10-CM | POA: Diagnosis not present

## 2021-09-03 HISTORY — PX: FASCIECTOMY: SHX6525

## 2021-09-03 HISTORY — PX: PATCH ANGIOPLASTY: SHX6230

## 2021-09-03 HISTORY — PX: ENDARTERECTOMY POPLITEAL: SHX5806

## 2021-09-03 HISTORY — PX: VEIN HARVEST: SHX6363

## 2021-09-03 HISTORY — PX: THROMBECTOMY FEMORAL ARTERY: SHX6406

## 2021-09-03 LAB — POCT I-STAT 7, (LYTES, BLD GAS, ICA,H+H)
Acid-base deficit: 4 mmol/L — ABNORMAL HIGH (ref 0.0–2.0)
Acid-base deficit: 3 mmol/L — ABNORMAL HIGH (ref 0.0–2.0)
Bicarbonate: 24.3 mmol/L (ref 20.0–28.0)
Bicarbonate: 25.3 mmol/L (ref 20.0–28.0)
Calcium, Ion: 1.16 mmol/L (ref 1.15–1.40)
Calcium, Ion: 1.18 mmol/L (ref 1.15–1.40)
HCT: 44 % (ref 39.0–52.0)
HCT: 43 % (ref 39.0–52.0)
Hemoglobin: 15 g/dL (ref 13.0–17.0)
Hemoglobin: 14.6 g/dL (ref 13.0–17.0)
O2 Saturation: 96 %
O2 Saturation: 96 %
Patient temperature: 37.1
Potassium: 3.9 mmol/L (ref 3.5–5.1)
Potassium: 4.7 mmol/L (ref 3.5–5.1)
Sodium: 138 mmol/L (ref 135–145)
Sodium: 138 mmol/L (ref 135–145)
TCO2: 26 mmol/L (ref 22–32)
TCO2: 27 mmol/L (ref 22–32)
pCO2 arterial: 54.5 mmHg — ABNORMAL HIGH (ref 32.0–48.0)
pCO2 arterial: 56.9 mmHg — ABNORMAL HIGH (ref 32.0–48.0)
pH, Arterial: 7.257 — ABNORMAL LOW (ref 7.350–7.450)
pH, Arterial: 7.257 — ABNORMAL LOW (ref 7.350–7.450)
pO2, Arterial: 99 mmHg (ref 83.0–108.0)
pO2, Arterial: 98 mmHg (ref 83.0–108.0)

## 2021-09-03 LAB — POCT ACTIVATED CLOTTING TIME
Activated Clotting Time: 305 seconds
Activated Clotting Time: 257 seconds
Activated Clotting Time: 239 seconds

## 2021-09-03 LAB — HEPARIN LEVEL (UNFRACTIONATED)
Heparin Unfractionated: <0.1 IU/mL — ABNORMAL LOW (ref 0.30–0.70)
Heparin Unfractionated: 0.11 IU/mL — ABNORMAL LOW (ref 0.30–0.70)

## 2021-09-03 LAB — CBC
HCT: 40.2 % (ref 39.0–52.0)
Hemoglobin: 13.4 g/dL (ref 13.0–17.0)
MCH: 33.2 pg (ref 26.0–34.0)
MCHC: 33.3 g/dL (ref 30.0–36.0)
MCV: 99.5 fL (ref 80.0–100.0)
Platelets: 111 10*3/uL — ABNORMAL LOW (ref 150–400)
RBC: 4.04 MIL/uL — ABNORMAL LOW (ref 4.22–5.81)
RDW: 14 % (ref 11.5–15.5)
WBC: 13.2 10*3/uL — ABNORMAL HIGH (ref 4.0–10.5)
nRBC: 0 % (ref 0.0–0.2)

## 2021-09-03 LAB — PHOSPHORUS
Phosphorus: 3.1 mg/dL (ref 2.5–4.6)
Phosphorus: 2.9 mg/dL (ref 2.5–4.6)

## 2021-09-03 LAB — GLUCOSE, CAPILLARY
Glucose-Capillary: 117 mg/dL — ABNORMAL HIGH (ref 70–99)
Glucose-Capillary: 120 mg/dL — ABNORMAL HIGH (ref 70–99)
Glucose-Capillary: 132 mg/dL — ABNORMAL HIGH (ref 70–99)
Glucose-Capillary: 133 mg/dL — ABNORMAL HIGH (ref 70–99)
Glucose-Capillary: 137 mg/dL — ABNORMAL HIGH (ref 70–99)
Glucose-Capillary: 137 mg/dL — ABNORMAL HIGH (ref 70–99)
Glucose-Capillary: 80 mg/dL (ref 70–99)

## 2021-09-03 LAB — COMPREHENSIVE METABOLIC PANEL
ALT: 32 U/L (ref 0–44)
AST: 55 U/L — ABNORMAL HIGH (ref 15–41)
Albumin: 2.5 g/dL — ABNORMAL LOW (ref 3.5–5.0)
Alkaline Phosphatase: 69 U/L (ref 38–126)
Anion gap: 7 (ref 5–15)
BUN: 21 mg/dL (ref 8–23)
CO2: 22 mmol/L (ref 22–32)
Calcium: 7.8 mg/dL — ABNORMAL LOW (ref 8.9–10.3)
Chloride: 105 mmol/L (ref 98–111)
Creatinine, Ser: 1.62 mg/dL — ABNORMAL HIGH (ref 0.61–1.24)
GFR, Estimated: 48 mL/min — ABNORMAL LOW (ref >60)
Glucose, Bld: 159 mg/dL — ABNORMAL HIGH (ref 70–99)
Potassium: 4.3 mmol/L (ref 3.5–5.1)
Sodium: 134 mmol/L — ABNORMAL LOW (ref 135–145)
Total Bilirubin: 0.9 mg/dL (ref 0.3–1.2)
Total Protein: 5.7 g/dL — ABNORMAL LOW (ref 6.5–8.1)

## 2021-09-03 LAB — COOXEMETRY PANEL
Carboxyhemoglobin: 1 % (ref 0.5–1.5)
Methemoglobin: 0.6 % (ref 0.0–1.5)
O2 Saturation: 78.5 %
Total hemoglobin: 14.2 g/dL (ref 12.0–16.0)

## 2021-09-03 LAB — SURGICAL PCR SCREEN
MRSA, PCR: NEGATIVE
Staphylococcus aureus: NEGATIVE

## 2021-09-03 LAB — MAGNESIUM
Magnesium: 2.5 mg/dL — ABNORMAL HIGH (ref 1.7–2.4)
Magnesium: 2.3 mg/dL (ref 1.7–2.4)

## 2021-09-03 SURGERY — THROMBECTOMY, ARTERY, FEMORAL
Anesthesia: General | Site: Leg Upper | Laterality: Right

## 2021-09-03 MED ORDER — LACTATED RINGERS IV SOLN: INTRAVENOUS | Status: DC | PRN

## 2021-09-03 MED ORDER — ONDANSETRON HCL 4 MG/2ML IJ SOLN
INTRAMUSCULAR | Status: DC | PRN
  Administered 2021-09-03: 4 mg via INTRAVENOUS

## 2021-09-03 MED ORDER — MAGNESIUM SULFATE 2 GM/50ML IV SOLN: 2.0000 g | Freq: Every day | INTRAVENOUS | Status: DC | PRN

## 2021-09-03 MED ORDER — HEPARIN 6000 UNIT IRRIGATION SOLUTION
Status: AC
  Filled 2021-09-03: qty 500

## 2021-09-03 MED ORDER — PAPAVERINE HCL 30 MG/ML IJ SOLN
INTRAMUSCULAR | Status: AC
  Filled 2021-09-03: qty 2

## 2021-09-03 MED ORDER — ASPIRIN 81 MG PO CHEW
81.0000 mg | CHEWABLE_TABLET | Freq: Every day | ORAL | Status: DC
  Administered 2021-09-04 – 2021-09-13 (×8): 81 mg
  Filled 2021-09-03 (×9): qty 1

## 2021-09-03 MED ORDER — HYDROMORPHONE HCL 1 MG/ML IJ SOLN: 0.5000 mg | INTRAMUSCULAR | Status: DC | PRN

## 2021-09-03 MED ORDER — HEPARIN 6000 UNIT IRRIGATION SOLUTION
Status: DC | PRN
  Administered 2021-09-03: 1

## 2021-09-03 MED ORDER — ONDANSETRON HCL 4 MG/2ML IJ SOLN
4.0000 mg | Freq: Four times a day (QID) | INTRAMUSCULAR | Status: DC | PRN
  Administered 2021-09-10: 4 mg via INTRAVENOUS
  Filled 2021-09-03: qty 2

## 2021-09-03 MED ORDER — ROCURONIUM BROMIDE 10 MG/ML (PF) SYRINGE
PREFILLED_SYRINGE | INTRAVENOUS | Status: DC | PRN
  Administered 2021-09-03 (×4): 50 mg via INTRAVENOUS

## 2021-09-03 MED ORDER — PAPAVERINE HCL 30 MG/ML IJ SOLN
INTRAMUSCULAR | Status: DC | PRN
  Administered 2021-09-03: 60 mg

## 2021-09-03 MED ORDER — MIDAZOLAM HCL 2 MG/2ML IJ SOLN
INTRAMUSCULAR | Status: AC
  Filled 2021-09-03: qty 2

## 2021-09-03 MED ORDER — HEPARIN BOLUS VIA INFUSION
2000.0000 [IU] | Freq: Once | INTRAVENOUS | Status: AC
  Administered 2021-09-03: 2000 [IU] via INTRAVENOUS
  Filled 2021-09-03: qty 2000

## 2021-09-03 MED ORDER — ROCURONIUM BROMIDE 10 MG/ML (PF) SYRINGE
PREFILLED_SYRINGE | INTRAVENOUS | Status: AC
  Filled 2021-09-03: qty 20

## 2021-09-03 MED ORDER — HEPARIN SODIUM (PORCINE) 1000 UNIT/ML IJ SOLN
INTRAMUSCULAR | Status: DC | PRN
  Administered 2021-09-03: 2000 [IU] via INTRAVENOUS
  Administered 2021-09-03: 1000 [IU] via INTRAVENOUS
  Administered 2021-09-03: 8000 [IU] via INTRAVENOUS

## 2021-09-03 MED ORDER — SODIUM CHLORIDE 0.9% IV SOLUTION: Freq: Once | INTRAVENOUS | Status: DC

## 2021-09-03 MED ORDER — MIDAZOLAM HCL 2 MG/2ML IJ SOLN
INTRAMUSCULAR | Status: DC | PRN
  Administered 2021-09-03: 2 mg via INTRAVENOUS

## 2021-09-03 MED ORDER — 0.9 % SODIUM CHLORIDE (POUR BTL) OPTIME
TOPICAL | Status: DC | PRN
  Administered 2021-09-03: 2500 mL

## 2021-09-03 MED ORDER — SODIUM CHLORIDE 0.9 % IV SOLN: 500.0000 mL | Freq: Once | INTRAVENOUS | Status: DC | PRN

## 2021-09-03 MED ORDER — PROPOFOL 1000 MG/100ML IV EMUL
5.0000 ug/kg/min | INTRAVENOUS | Status: DC
  Administered 2021-09-03 (×4): 40 ug/kg/min via INTRAVENOUS
  Administered 2021-09-03: 30 ug/kg/min via INTRAVENOUS
  Administered 2021-09-03 (×2): 40 ug/kg/min via INTRAVENOUS
  Administered 2021-09-04: 05:00:00 25 ug/kg/min via INTRAVENOUS
  Filled 2021-09-03 (×6): qty 100

## 2021-09-03 MED ORDER — HEPARIN (PORCINE) 25000 UT/250ML-% IV SOLN
2100.0000 [IU]/h | INTRAVENOUS | Status: DC
  Administered 2021-09-03: 1200 [IU]/h via INTRAVENOUS
  Administered 2021-09-04: 10:00:00 1700 [IU]/h via INTRAVENOUS
  Administered 2021-09-04 – 2021-09-05 (×3): 2100 [IU]/h via INTRAVENOUS
  Filled 2021-09-03 (×5): qty 250

## 2021-09-03 SURGICAL SUPPLY — 62 items
ADH SKN CLS APL DERMABOND .7 (GAUZE/BANDAGES/DRESSINGS) ×3
ARMBAND PINK RESTRICT EXTREMIT (MISCELLANEOUS) ×5 IMPLANT
BAG COUNTER SPONGE SURGICOUNT (BAG) ×4 IMPLANT
BAG SPNG CNTER NS LX DISP (BAG) ×3
BAG SURGICOUNT SPONGE COUNTING (BAG) ×1
BANDAGE ESMARK 6X9 LF (GAUZE/BANDAGES/DRESSINGS) ×3 IMPLANT
BNDG CMPR 9X6 STRL LF SNTH (GAUZE/BANDAGES/DRESSINGS) ×3
BNDG ESMARK 6X9 LF (GAUZE/BANDAGES/DRESSINGS) ×5
CANISTER SUCT 3000ML PPV (MISCELLANEOUS) ×5 IMPLANT
CANNULA VESSEL 3MM 2 BLNT TIP (CANNULA) ×15 IMPLANT
CATH EMB 2FR 60CM (CATHETERS) ×5 IMPLANT
CATH EMB 3FR 80CM (CATHETERS) ×5 IMPLANT
CATH EMB 4FR 80CM (CATHETERS) ×5 IMPLANT
CLIP VESOCCLUDE MED 6/CT (CLIP) ×5 IMPLANT
CLIP VESOCCLUDE SM WIDE 6/CT (CLIP) ×5 IMPLANT
CNTNR URN SCR LID CUP LEK RST (MISCELLANEOUS) ×3 IMPLANT
CONT SPEC 4OZ STRL OR WHT (MISCELLANEOUS) ×5
CUFF TOURN SGL QUICK 24 (TOURNIQUET CUFF) ×5
CUFF TRNQT CYL 24X4X40X1 (TOURNIQUET CUFF) ×3 IMPLANT
DERMABOND ADVANCED (GAUZE/BANDAGES/DRESSINGS) ×2
DERMABOND ADVANCED .7 DNX12 (GAUZE/BANDAGES/DRESSINGS) ×3 IMPLANT
DRAIN CHANNEL 15F RND FF W/TCR (WOUND CARE) ×10 IMPLANT
DRAPE X-RAY CASS 24X20 (DRAPES) IMPLANT
DRSG COVADERM 4X10 (GAUZE/BANDAGES/DRESSINGS) ×10 IMPLANT
ELECT REM PT RETURN 9FT ADLT (ELECTROSURGICAL) ×5
ELECTRODE REM PT RTRN 9FT ADLT (ELECTROSURGICAL) ×3 IMPLANT
EVACUATOR SILICONE 100CC (DRAIN) ×10 IMPLANT
GAUZE 4X4 16PLY ~~LOC~~+RFID DBL (SPONGE) ×5 IMPLANT
GLOVE SRG 8 PF TXTR STRL LF DI (GLOVE) ×3 IMPLANT
GLOVE SURG ENC MOIS LTX SZ7.5 (GLOVE) ×5 IMPLANT
GLOVE SURG POLY ORTHO LF SZ7.5 (GLOVE) IMPLANT
GLOVE SURG UNDER LTX SZ8 (GLOVE) ×5 IMPLANT
GLOVE SURG UNDER POLY LF SZ8 (GLOVE) ×5
GOWN STRL REUS W/ TWL LRG LVL3 (GOWN DISPOSABLE) ×9 IMPLANT
GOWN STRL REUS W/TWL LRG LVL3 (GOWN DISPOSABLE) ×15
KIT BASIN OR (CUSTOM PROCEDURE TRAY) ×5 IMPLANT
KIT TURNOVER KIT B (KITS) ×5 IMPLANT
NS IRRIG 1000ML POUR BTL (IV SOLUTION) ×10 IMPLANT
PACK PERIPHERAL VASCULAR (CUSTOM PROCEDURE TRAY) ×5 IMPLANT
PAD ARMBOARD 7.5X6 YLW CONV (MISCELLANEOUS) ×10 IMPLANT
PADDING CAST COTTON 6X4 STRL (CAST SUPPLIES) ×5 IMPLANT
PATCH VASC XENOSURE 1CMX6CM (Vascular Products) ×5 IMPLANT
PATCH VASC XENOSURE 1X6 (Vascular Products) ×3 IMPLANT
SET COLLECT BLD 21X3/4 12 (NEEDLE) IMPLANT
SPONGE SURGIFOAM ABS GEL 100 (HEMOSTASIS) IMPLANT
SPONGE T-LAP 18X18 ~~LOC~~+RFID (SPONGE) ×10 IMPLANT
STAPLER VISISTAT 35W (STAPLE) ×5 IMPLANT
STOPCOCK 4 WAY LG BORE MALE ST (IV SETS) IMPLANT
SUT ETHILON 3 0 PS 1 (SUTURE) ×10 IMPLANT
SUT MNCRL AB 4-0 PS2 18 (SUTURE) ×5 IMPLANT
SUT PROLENE 6 0 BV (SUTURE) ×10 IMPLANT
SUT SILK 3 0 (SUTURE) ×5
SUT SILK 3-0 18XBRD TIE 12 (SUTURE) ×3 IMPLANT
SUT VIC AB 2-0 CTB1 (SUTURE) ×10 IMPLANT
SUT VIC AB 3-0 SH 27 (SUTURE) ×10
SUT VIC AB 3-0 SH 27X BRD (SUTURE) ×6 IMPLANT
SYR 3ML LL SCALE MARK (SYRINGE) ×10 IMPLANT
SYR 5ML LL (SYRINGE) ×5 IMPLANT
TOWEL GREEN STERILE (TOWEL DISPOSABLE) ×5 IMPLANT
TUBING EXTENTION W/L.L. (IV SETS) IMPLANT
UNDERPAD 30X36 HEAVY ABSORB (UNDERPADS AND DIAPERS) ×5 IMPLANT
WATER STERILE IRR 1000ML POUR (IV SOLUTION) ×5 IMPLANT

## 2021-09-03 NOTE — Transfer of Care (Signed)
Immediate Anesthesia Transfer of Care Note  Patient: Frederick Castaneda  Procedure(s) Performed: THROMBECTOMY RIGHT FEMORAL ARTERY, RIGHT POPITEAL ARTERY, RIGHT TIBIAL ARTERY (Right: Groin) ENDARTERECTOMY RIGHT TIBIAL PERONEAL TRUNK ARTERY (Right: Leg Lower) VEIN PATCH RIGHT COMMON FEMORAL ARTERY USING RIGHT GREATER SAPHENOUS VEIN, PATCH ANGIOPLASTY RIGHT TIBIAL PERONEAL TRUNK USING XENOSURE BIOLOGIC PATCH (Right: Leg Lower) VEIN HARVEST RIGHT GREATER SAPHENOUS VEIN (Right: Leg Upper) FOUR COMPARTMENT FASCIECTOMY RIGHT LOWER LEG (Right: Leg Lower)  Patient Location: SICU  Anesthesia Type:General  Level of Consciousness: Patient remains intubated per anesthesia plan  Airway & Oxygen Therapy: Patient remains intubated per anesthesia plan  Post-op Assessment: Report given to RN and Post -op Vital signs reviewed and stable  Post vital signs: Reviewed and stable  Last Vitals:  Vitals Value Taken Time  BP    Temp 37.1 C 09/06/2021 0741  Pulse 98 09/30/2021 0741  Resp 24 10/01/2021 0741  SpO2 94 % 09/27/2021 0741  Vitals shown include unvalidated device data.  Last Pain:  Vitals:   09/02/2021 0000  TempSrc: Bladder         Complications: No notable events documented.
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
## 2021-09-03 NOTE — Progress Notes (Signed)
eLink Physician-Brief Progress Note Patient Name: Frederick Castaneda DOB: Aug 01, 1961 MRN: [id number]   Date of Service  09/23/2021  HPI/Events of Note  Patient with a cold, pulseless extremity below the knee, he has a left ventricular apical thrombus. Pulse could not be detected by bedside doppler.  eICU Interventions  Stat arterial vascular ultrasound of the right lower extremity ordered, as well as a stat vascular surgery consult.        Thomasene Lot Zahlia Deshazer 09/22/2021, 1:55 AM
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
## 2021-09-03 NOTE — Anesthesia Postprocedure Evaluation (Signed)
Anesthesia Post Note  Patient: GENESIS PAGET  Procedure(s) Performed: THROMBECTOMY RIGHT FEMORAL ARTERY, RIGHT POPITEAL ARTERY, RIGHT TIBIAL ARTERY (Right: Groin) ENDARTERECTOMY RIGHT TIBIAL PERONEAL TRUNK ARTERY (Right: Leg Lower) VEIN PATCH RIGHT COMMON FEMORAL ARTERY USING RIGHT GREATER SAPHENOUS VEIN, PATCH ANGIOPLASTY RIGHT TIBIAL PERONEAL TRUNK USING XENOSURE BIOLOGIC PATCH (Right: Leg Lower) VEIN HARVEST RIGHT GREATER SAPHENOUS VEIN (Right: Leg Upper) FOUR COMPARTMENT FASCIECTOMY RIGHT LOWER LEG (Right: Leg Lower)     Patient location during evaluation: SICU Anesthesia Type: General Level of consciousness: sedated Pain management: pain level controlled Vital Signs Assessment: post-procedure vital signs reviewed and stable Respiratory status: patient remains intubated per anesthesia plan Cardiovascular status: stable Postop Assessment: no apparent nausea or vomiting Anesthetic complications: no   No notable events documented.  Last Vitals:  Vitals:   09/28/2021 1200 09/28/2021 1230  BP: 117/75 115/76  Pulse: 86   Resp: (!) 24   Temp: 37.5 C   SpO2: 97%     Last Pain:  Vitals:   09/26/2021 0000  TempSrc: Bladder                 Shazia Mitchener

## 2021-09-03 NOTE — Anesthesia Procedure Notes (Signed)
Arterial Line Insertion Start/End12/27/2022 4:00 AM, 09/03/2021 4:15 AM Performed by: Gaynelle Adu, MD  Patient location: OR. Preanesthetic checklist: patient identified, IV checked, site marked, risks and benefits discussed, surgical consent, monitors and equipment checked, pre-op evaluation, timeout performed and anesthesia consent Left, brachial was placed Catheter size: 20 G Hand hygiene performed , maximum sterile barriers used  and Seldinger technique used  Attempts: 3 (Attempted radial artery x 2) Procedure performed using ultrasound guided technique. Ultrasound Notes:anatomy identified, needle tip was noted to be adjacent to the nerve/plexus identified, no ultrasound evidence of intravascular and/or intraneural injection and image(s) printed for medical record Following insertion, dressing applied, line sutured and Biopatch. Post procedure assessment: normal and unchanged  Post procedure complications: unsuccessful attempts. Patient tolerated the procedure well with no immediate complications.
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
## 2021-09-03 NOTE — Anesthesia Preprocedure Evaluation (Addendum)
Anesthesia Evaluation  Patient identified by MRN, date of birth, ID band Patient unresponsive    Reviewed: Allergy & Precautions, H&P , NPO status , Patient's Chart, lab work & pertinent test results  Airway Mallampati: Intubated       Dental no notable dental hx. (+) Teeth Intact, Dental Advisory Given   Pulmonary neg pulmonary ROS,  Intubated on vent   Pulmonary exam normal breath sounds clear to auscultation       Cardiovascular negative cardio ROS   Rhythm:Regular Rate:Tachycardia  S/p PEA arrest Ischemic RLE   Neuro/Psych negative neurological ROS  negative psych ROS   GI/Hepatic negative GI ROS, Neg liver ROS,   Endo/Other  negative endocrine ROS  Renal/GU negative Renal ROS  negative genitourinary   Musculoskeletal   Abdominal   Peds  Hematology negative hematology ROS (+)   Anesthesia Other Findings   Reproductive/Obstetrics negative OB ROS                            Anesthesia Physical Anesthesia Plan  ASA: 4 and emergent  Anesthesia Plan: General   Post-op Pain Management:    Induction: Intravenous  PONV Risk Score and Plan: 2 and Treatment may vary due to age or medical condition  Airway Management Planned: Oral ETT  Additional Equipment: Arterial line  Intra-op Plan:   Post-operative Plan: Post-operative intubation/ventilation  Informed Consent: I have reviewed the patients History and Physical, chart, labs and discussed the procedure including the risks, benefits and alternatives for the proposed anesthesia with the patient or authorized representative who has indicated his/her understanding and acceptance.     Dental advisory given  Plan Discussed with: CRNA  Anesthesia Plan Comments:         Anesthesia Quick Evaluation
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
## 2021-09-04 ENCOUNTER — Inpatient Hospital Stay (HOSPITAL_COMMUNITY): Payer: 59

## 2021-09-04 DIAGNOSIS — I743 Embolism and thrombosis of arteries of the lower extremities: Secondary | ICD-10-CM

## 2021-09-04 DIAGNOSIS — R570 Cardiogenic shock: Secondary | ICD-10-CM | POA: Diagnosis not present

## 2021-09-04 DIAGNOSIS — R4182 Altered mental status, unspecified: Secondary | ICD-10-CM

## 2021-09-04 DIAGNOSIS — I513 Intracardiac thrombosis, not elsewhere classified: Secondary | ICD-10-CM | POA: Diagnosis not present

## 2021-09-04 DIAGNOSIS — I5021 Acute systolic (congestive) heart failure: Secondary | ICD-10-CM | POA: Diagnosis not present

## 2021-09-04 DIAGNOSIS — I469 Cardiac arrest, cause unspecified: Secondary | ICD-10-CM | POA: Diagnosis not present

## 2021-09-04 LAB — BLOOD GAS, ARTERIAL
Acid-base deficit: 4.1 mmol/L — ABNORMAL HIGH (ref 0.0–2.0)
Bicarbonate: 19.2 mmol/L — ABNORMAL LOW (ref 20.0–28.0)
FIO2: 40
O2 Saturation: 96.8 %
Patient temperature: 37.8
pCO2 arterial: 28.9 mmHg — ABNORMAL LOW (ref 32.0–48.0)
pH, Arterial: 7.442 (ref 7.350–7.450)
pO2, Arterial: 86 mmHg (ref 83.0–108.0)

## 2021-09-04 LAB — HEPATIC FUNCTION PANEL
ALT: 53 U/L — ABNORMAL HIGH (ref 0–44)
AST: 130 U/L — ABNORMAL HIGH (ref 15–41)
Albumin: 2.2 g/dL — ABNORMAL LOW (ref 3.5–5.0)
Alkaline Phosphatase: 62 U/L (ref 38–126)
Bilirubin, Direct: 0.8 mg/dL — ABNORMAL HIGH (ref 0.0–0.2)
Indirect Bilirubin: 1 mg/dL — ABNORMAL HIGH (ref 0.3–0.9)
Total Bilirubin: 1.8 mg/dL — ABNORMAL HIGH (ref 0.3–1.2)
Total Protein: 5.5 g/dL — ABNORMAL LOW (ref 6.5–8.1)

## 2021-09-04 LAB — PHOSPHORUS
Phosphorus: 3.5 mg/dL (ref 2.5–4.6)
Phosphorus: 3.4 mg/dL (ref 2.5–4.6)

## 2021-09-04 LAB — HEPARIN LEVEL (UNFRACTIONATED)
Heparin Unfractionated: 0.12 IU/mL — ABNORMAL LOW (ref 0.30–0.70)
Heparin Unfractionated: 0.26 IU/mL — ABNORMAL LOW (ref 0.30–0.70)
Heparin Unfractionated: <0.1 IU/mL — ABNORMAL LOW (ref 0.30–0.70)

## 2021-09-04 LAB — BASIC METABOLIC PANEL
Anion gap: 7 (ref 5–15)
BUN: 14 mg/dL (ref 8–23)
CO2: 19 mmol/L — ABNORMAL LOW (ref 22–32)
Calcium: 7.8 mg/dL — ABNORMAL LOW (ref 8.9–10.3)
Chloride: 109 mmol/L (ref 98–111)
Creatinine, Ser: 1.29 mg/dL — ABNORMAL HIGH (ref 0.61–1.24)
GFR, Estimated: >60 mL/min (ref >60)
Glucose, Bld: 112 mg/dL — ABNORMAL HIGH (ref 70–99)
Potassium: 4.2 mmol/L (ref 3.5–5.1)
Sodium: 135 mmol/L (ref 135–145)

## 2021-09-04 LAB — LIPID PANEL
Cholesterol: 121 mg/dL (ref 0–200)
HDL: 40 mg/dL — ABNORMAL LOW (ref >40)
LDL Cholesterol: 61 mg/dL (ref 0–99)
Total CHOL/HDL Ratio: 3 RATIO
Triglycerides: 98 mg/dL (ref <150)
VLDL: 20 mg/dL (ref 0–40)

## 2021-09-04 LAB — GLUCOSE, CAPILLARY
Glucose-Capillary: 105 mg/dL — ABNORMAL HIGH (ref 70–99)
Glucose-Capillary: 112 mg/dL — ABNORMAL HIGH (ref 70–99)
Glucose-Capillary: 139 mg/dL — ABNORMAL HIGH (ref 70–99)
Glucose-Capillary: 74 mg/dL (ref 70–99)
Glucose-Capillary: 84 mg/dL (ref 70–99)
Glucose-Capillary: 86 mg/dL (ref 70–99)

## 2021-09-04 LAB — CBC
HCT: 35.7 % — ABNORMAL LOW (ref 39.0–52.0)
Hemoglobin: 12 g/dL — ABNORMAL LOW (ref 13.0–17.0)
MCH: 33.2 pg (ref 26.0–34.0)
MCHC: 33.6 g/dL (ref 30.0–36.0)
MCV: 98.9 fL (ref 80.0–100.0)
Platelets: 91 10*3/uL — ABNORMAL LOW (ref 150–400)
RBC: 3.61 MIL/uL — ABNORMAL LOW (ref 4.22–5.81)
RDW: 14.1 % (ref 11.5–15.5)
WBC: 11.6 10*3/uL — ABNORMAL HIGH (ref 4.0–10.5)
nRBC: 0 % (ref 0.0–0.2)

## 2021-09-04 LAB — COOXEMETRY PANEL
Carboxyhemoglobin: 1.5 % (ref 0.5–1.5)
Methemoglobin: 0.9 % (ref 0.0–1.5)
O2 Saturation: 69.2 %
Total hemoglobin: 8.6 g/dL — ABNORMAL LOW (ref 12.0–16.0)

## 2021-09-04 LAB — MAGNESIUM
Magnesium: 2.1 mg/dL (ref 1.7–2.4)
Magnesium: 2.2 mg/dL (ref 1.7–2.4)

## 2021-09-04 MED ORDER — ACETAMINOPHEN 650 MG RE SUPP
650.0000 mg | Freq: Four times a day (QID) | RECTAL | Status: DC
Start: 2021-09-04 — End: 2021-09-11

## 2021-09-04 MED ORDER — VITAL HIGH PROTEIN PO LIQD
1000.0000 mL | ORAL | Status: DC
  Administered 2021-09-04 – 2021-09-05 (×2): 1000 mL

## 2021-09-04 MED ORDER — DEXMEDETOMIDINE HCL IN NACL 400 MCG/100ML IV SOLN
0.4000 ug/kg/h | INTRAVENOUS | Status: DC
  Administered 2021-09-04: 23:00:00 1.2 ug/kg/h via INTRAVENOUS
  Administered 2021-09-04: 11:00:00 0.4 ug/kg/h via INTRAVENOUS
  Administered 2021-09-04: 17:00:00 0.6 ug/kg/h via INTRAVENOUS
  Administered 2021-09-04 – 2021-09-05 (×2): 1 ug/kg/h via INTRAVENOUS
  Administered 2021-09-05: 15:00:00 0.6 ug/kg/h via INTRAVENOUS
  Administered 2021-09-05: 13:00:00 1 ug/kg/h via INTRAVENOUS
  Administered 2021-09-05 (×2): 0.6 ug/kg/h via INTRAVENOUS
  Administered 2021-09-06: 21:00:00 1.2 ug/kg/h via INTRAVENOUS
  Administered 2021-09-06: 0.7 ug/kg/h via INTRAVENOUS
  Administered 2021-09-06: 02:00:00 0.6 ug/kg/h via INTRAVENOUS
  Administered 2021-09-06: 15:00:00 1.1 ug/kg/h via INTRAVENOUS
  Administered 2021-09-06: 18:00:00 1 ug/kg/h via INTRAVENOUS
  Administered 2021-09-06: 0.9 ug/kg/h via INTRAVENOUS
  Administered 2021-09-07: 1.2 ug/kg/h via INTRAVENOUS
  Administered 2021-09-07: 1 ug/kg/h via INTRAVENOUS
  Administered 2021-09-07: 1.2 ug/kg/h via INTRAVENOUS
  Administered 2021-09-07: 1.1 ug/kg/h via INTRAVENOUS
  Administered 2021-09-07 (×2): 1.2 ug/kg/h via INTRAVENOUS
  Administered 2021-09-08: 1 ug/kg/h via INTRAVENOUS
  Administered 2021-09-08: 1.2 ug/kg/h via INTRAVENOUS
  Administered 2021-09-08: 0.6 ug/kg/h via INTRAVENOUS
  Administered 2021-09-08: 0.8 ug/kg/h via INTRAVENOUS
  Administered 2021-09-08: 1.2 ug/kg/h via INTRAVENOUS
  Administered 2021-09-09 (×4): 0.6 ug/kg/h via INTRAVENOUS
  Administered 2021-09-10 (×2): 0.7 ug/kg/h via INTRAVENOUS
  Filled 2021-09-04 (×5): qty 100
  Filled 2021-09-04: qty 200
  Filled 2021-09-04 (×27): qty 100

## 2021-09-04 MED ORDER — ACETAMINOPHEN 325 MG PO TABS
650.0000 mg | ORAL_TABLET | Freq: Four times a day (QID) | ORAL | Status: DC
Start: 2021-09-04 — End: 2021-09-11
  Administered 2021-09-04 – 2021-09-11 (×24): 650 mg
  Filled 2021-09-04 (×25): qty 2

## 2021-09-04 MED ORDER — FUROSEMIDE 10 MG/ML IJ SOLN
40.0000 mg | Freq: Once | INTRAMUSCULAR | Status: AC
  Administered 2021-09-04: 11:00:00 40 mg via INTRAVENOUS
  Filled 2021-09-04: qty 4

## 2021-09-04 MED ORDER — PROSOURCE TF PO LIQD
45.0000 mL | Freq: Two times a day (BID) | ORAL | Status: DC
  Administered 2021-09-04 – 2021-09-05 (×3): 45 mL
  Filled 2021-09-04 (×3): qty 45

## 2021-09-04 MED ORDER — TRAMADOL HCL 50 MG PO TABS
50.0000 mg | ORAL_TABLET | Freq: Four times a day (QID) | ORAL | Status: DC
  Administered 2021-09-04 – 2021-09-06 (×6): 50 mg
  Filled 2021-09-04 (×7): qty 1

## 2021-09-04 MED ORDER — DOCUSATE SODIUM 50 MG/5ML PO LIQD
100.0000 mg | Freq: Two times a day (BID) | ORAL | Status: DC | PRN
  Administered 2021-09-04: 21:00:00 100 mg

## 2021-09-04 MED ORDER — GABAPENTIN 250 MG/5ML PO SOLN
300.0000 mg | Freq: Two times a day (BID) | ORAL | Status: DC
  Administered 2021-09-04 – 2021-09-13 (×18): 300 mg
  Filled 2021-09-04 (×23): qty 6
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
## 2021-09-04 NOTE — Progress Notes (Signed)
   VASCULAR SURGERY ASSESSMENT & PLAN:   ISCHEMIC RIGHT LEG: This patient is POD 1 after right femoral thrombectomy.  A large amount of fresh clot was retrieved from the femoral and popliteal artery.  However the tibial arteries were all chronically occluded and he has no options for revascularization on the right.  If he stabilizes, and shows neurologic improvement, we could consider a right above-the-knee amputation on Tuesday.  SUBJECTIVE:   Sedated on vent.   PHYSICAL EXAM:   Vitals:   09/04/21 0300 09/04/21 0400 09/04/21 0439 09/04/21 0500  BP: 101/82 101/68  111/83  Pulse: 89 79  (!) 45  Resp: (!) 24 (!) 24  (!) 24  Temp: 99.9 F (37.7 C) 99.7 F (37.6 C)  99.9 F (37.7 C)  TempSrc:      SpO2: 95% 98%  98%  Weight:   110.6 kg   Height:       Right foot mottled and cold.  No doppler flow on right.  Incisions fine.   LABS:   Lab Results  Component Value Date   WBC 11.6 (H) 09/04/2021   HGB 12.0 (L) 09/04/2021   HCT 35.7 (L) 09/04/2021   MCV 98.9 09/04/2021   PLT 91 (L) 09/04/2021   Lab Results  Component Value Date   CREATININE 1.29 (H) 09/04/2021   Lab Results  Component Value Date   INR 1.1 09/24/2021   CBG (last 3)  Recent Labs    09/02/2021 1911 10/01/2021 2325 09/04/21 0339  GLUCAP 120* 117* 112*    PROBLEM LIST:    Principal Problem:   AMS (altered mental status)   CURRENT MEDS:    sodium chloride   Intravenous Once   aspirin  81 mg Per Tube Q0600   chlorhexidine gluconate (MEDLINE KIT)  15 mL Mouth Rinse BID   Chlorhexidine Gluconate Cloth  6 each Topical Q0600   feeding supplement (PROSource TF)  90 mL Per Tube BID   insulin aspart  0-15 Units Subcutaneous Q4H   mouth rinse  15 mL Mouth Rinse 10 times per day   pantoprazole sodium  40 mg Per Tube Daily    Deitra Mayo Office: [phone] 09/04/2021
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
## 2021-09-04 NOTE — Progress Notes (Signed)
ANTICOAGULATION CONSULT NOTE Pharmacy Consult for Heparin  Indication: LV thrombus  No Known Allergies  Patient Measurements: Height: 6' (182.9 cm) Weight: 108.7 kg (239 lb 10.2 oz) IBW/kg (Calculated) : 77.6   Vital Signs: Temp: 99.7 F (37.6 C) (12/04 0000) Temp Source: Bladder (12/03 2000) BP: 108/73 (12/04 0000) Pulse Rate: 82 (12/04 0000)  Labs: Recent Labs    09/28/2021 0810 09/21/2021 0823 09/09/2021 0914 09/17/2021 1118 09/20/2021 1216 09/02/2021 1547 09/02/21 0523 09/02/21 1035 09/02/21 1212 09/02/21 1730 09/18/2021 0158 09/22/2021 0159 09/13/2021 0417 09/02/2021 0700 09/03/21 1506 09/04/21 0000  HGB 17.5*   < >  --    < >  --    < > 16.6  --   --   --  13.4  --  15.0 14.6  --   --   HCT 55.1*   < >  --    < >  --    < > 51.0  --   --   --  40.2  --  44.0 43.0  --   --   PLT 202  --   --   --   --   --  170  --   --   --  111*  --   --   --   --   --   APTT  --   --   --   --  25  --   --   --   --   --   --   --   --   --   --   --   LABPROT  --   --  13.8  --  13.9  --   --   --   --   --   --   --   --   --   --   --   INR  --   --  1.1  --  1.1  --   --   --   --   --   --   --   --   --   --   --   HEPARINUNFRC  --   --   --   --   --   --   --   --   --    < >  --  <0.10*  --   --  0.11* <0.10*  CREATININE 1.51*   < >  --   --   --   --  2.43* 1.90*  --   --  1.62*  --   --   --   --   --   TROPONINIHS 129*  --   --   --  2,835*  --   --  485* 394*  --   --   --   --   --   --   --    < > = values in this interval not displayed.     Estimated Creatinine Clearance: 61 mL/min (A) (by C-G formula based on SCr of 1.62 mg/dL (H)).  Assessment: 60 y.o. male with LV thrombus s/p femoral thrombectomy 12/3 for heparin.  Heparin level subtherapeutic  Goal of Therapy:  Heparin level 0.3-0.7 units/ml Monitor platelets by anticoagulation protocol: Yes   Plan:  Increase Heparin 1700 units/hr Check heparin level in 8 hours.  Geannie Risen, PharmD, BCPS 09/04/2021 1:01 AM

## 2021-09-05 ENCOUNTER — Encounter (HOSPITAL_COMMUNITY): Payer: Self-pay | Admitting: Vascular Surgery

## 2021-09-05 DIAGNOSIS — Z9911 Dependence on respirator [ventilator] status: Secondary | ICD-10-CM | POA: Diagnosis not present

## 2021-09-05 DIAGNOSIS — J9601 Acute respiratory failure with hypoxia: Secondary | ICD-10-CM | POA: Diagnosis not present

## 2021-09-05 DIAGNOSIS — R4182 Altered mental status, unspecified: Secondary | ICD-10-CM | POA: Diagnosis not present

## 2021-09-05 DIAGNOSIS — I4901 Ventricular fibrillation: Principal | ICD-10-CM

## 2021-09-05 LAB — COOXEMETRY PANEL
Carboxyhemoglobin: 1.3 % (ref 0.5–1.5)
Carboxyhemoglobin: 1.4 % (ref 0.5–1.5)
Carboxyhemoglobin: 1.5 % (ref 0.5–1.5)
Carboxyhemoglobin: 1.3 % (ref 0.5–1.5)
Methemoglobin: 0.8 % (ref 0.0–1.5)
Methemoglobin: 0.6 % (ref 0.0–1.5)
Methemoglobin: 0.6 % (ref 0.0–1.5)
Methemoglobin: 0.7 % (ref 0.0–1.5)
O2 Saturation: 50.3 %
O2 Saturation: 97.7 %
O2 Saturation: 89.6 %
O2 Saturation: 56.9 %
Total hemoglobin: 12.5 g/dL (ref 12.0–16.0)
Total hemoglobin: 12.1 g/dL (ref 12.0–16.0)
Total hemoglobin: 12.9 g/dL (ref 12.0–16.0)
Total hemoglobin: 12.9 g/dL (ref 12.0–16.0)

## 2021-09-05 LAB — GLUCOSE, CAPILLARY
Glucose-Capillary: 82 mg/dL (ref 70–99)
Glucose-Capillary: 115 mg/dL — ABNORMAL HIGH (ref 70–99)
Glucose-Capillary: 129 mg/dL — ABNORMAL HIGH (ref 70–99)
Glucose-Capillary: 137 mg/dL — ABNORMAL HIGH (ref 70–99)
Glucose-Capillary: 139 mg/dL — ABNORMAL HIGH (ref 70–99)

## 2021-09-05 LAB — CBC
HCT: 38.1 % — ABNORMAL LOW (ref 39.0–52.0)
Hemoglobin: 12.5 g/dL — ABNORMAL LOW (ref 13.0–17.0)
MCH: 33.2 pg (ref 26.0–34.0)
MCHC: 32.8 g/dL (ref 30.0–36.0)
MCV: 101.1 fL — ABNORMAL HIGH (ref 80.0–100.0)
Platelets: 96 10*3/uL — ABNORMAL LOW (ref 150–400)
RBC: 3.77 MIL/uL — ABNORMAL LOW (ref 4.22–5.81)
RDW: 14.4 % (ref 11.5–15.5)
WBC: 12.1 10*3/uL — ABNORMAL HIGH (ref 4.0–10.5)
nRBC: 0.2 % (ref 0.0–0.2)

## 2021-09-05 LAB — BASIC METABOLIC PANEL
Anion gap: 7 (ref 5–15)
BUN: 25 mg/dL — ABNORMAL HIGH (ref 8–23)
CO2: 18 mmol/L — ABNORMAL LOW (ref 22–32)
Calcium: 8 mg/dL — ABNORMAL LOW (ref 8.9–10.3)
Chloride: 109 mmol/L (ref 98–111)
Creatinine, Ser: 1.3 mg/dL — ABNORMAL HIGH (ref 0.61–1.24)
GFR, Estimated: >60 mL/min (ref >60)
Glucose, Bld: 133 mg/dL — ABNORMAL HIGH (ref 70–99)
Potassium: 4.9 mmol/L (ref 3.5–5.1)
Sodium: 134 mmol/L — ABNORMAL LOW (ref 135–145)

## 2021-09-05 LAB — PHOSPHORUS
Phosphorus: 4.1 mg/dL (ref 2.5–4.6)
Phosphorus: 3.4 mg/dL (ref 2.5–4.6)

## 2021-09-05 LAB — TRIGLYCERIDES: Triglycerides: 93 mg/dL (ref <150)

## 2021-09-05 LAB — MAGNESIUM
Magnesium: 2.5 mg/dL — ABNORMAL HIGH (ref 1.7–2.4)
Magnesium: 1.9 mg/dL (ref 1.7–2.4)

## 2021-09-05 LAB — HEPARIN LEVEL (UNFRACTIONATED): Heparin Unfractionated: 0.35 IU/mL (ref 0.30–0.70)

## 2021-09-05 MED ORDER — SODIUM CHLORIDE 3 % IN NEBU
4.0000 mL | INHALATION_SOLUTION | RESPIRATORY_TRACT | Status: AC
  Administered 2021-09-05 – 2021-09-08 (×17): 4 mL via RESPIRATORY_TRACT
  Filled 2021-09-05 (×18): qty 4

## 2021-09-05 MED ORDER — MIDAZOLAM HCL 2 MG/2ML IJ SOLN
2.0000 mg | INTRAMUSCULAR | Status: DC | PRN
  Administered 2021-09-05: 2 mg via INTRAVENOUS
  Filled 2021-09-05: qty 2

## 2021-09-05 MED ORDER — PROSOURCE TF PO LIQD: 45.0000 mL | Freq: Four times a day (QID) | ORAL | Status: DC

## 2021-09-05 MED ORDER — PROSOURCE TF PO LIQD
90.0000 mL | Freq: Three times a day (TID) | ORAL | Status: DC
  Administered 2021-09-05 – 2021-09-13 (×23): 90 mL
  Filled 2021-09-05 (×24): qty 90

## 2021-09-05 MED ORDER — POLYETHYLENE GLYCOL 3350 17 G PO PACK
17.0000 g | PACK | Freq: Every day | ORAL | Status: DC
  Administered 2021-09-05 – 2021-09-13 (×6): 17 g
  Filled 2021-09-05 (×8): qty 1

## 2021-09-05 MED ORDER — VITAL 1.5 CAL PO LIQD
1000.0000 mL | ORAL | Status: DC
  Administered 2021-09-05 – 2021-09-06 (×2): 1000 mL

## 2021-09-05 MED ORDER — FUROSEMIDE 10 MG/ML IJ SOLN
80.0000 mg | Freq: Two times a day (BID) | INTRAMUSCULAR | Status: DC
  Administered 2021-09-05 – 2021-09-07 (×5): 80 mg via INTRAVENOUS
  Filled 2021-09-05 (×5): qty 8

## 2021-09-05 MED ORDER — MIDAZOLAM HCL 2 MG/2ML IJ SOLN
1.0000 mg | INTRAMUSCULAR | Status: DC | PRN
Start: 2021-09-05 — End: 2021-09-06
  Administered 2021-09-05 – 2021-09-06 (×8): 2 mg via INTRAVENOUS
  Filled 2021-09-05 (×8): qty 2

## 2021-09-05 MED ORDER — SENNOSIDES 8.8 MG/5ML PO SYRP
5.0000 mL | ORAL_SOLUTION | Freq: Two times a day (BID) | ORAL | Status: DC
  Administered 2021-09-05 – 2021-09-13 (×14): 5 mL
  Filled 2021-09-05 (×15): qty 5

## 2021-09-05 MED ORDER — FENTANYL CITRATE PF 50 MCG/ML IJ SOSY
50.0000 ug | PREFILLED_SYRINGE | INTRAMUSCULAR | Status: AC | PRN
  Administered 2021-09-04 – 2021-09-05 (×3): 100 ug via INTRAVENOUS
  Administered 2021-09-05: 50 ug via INTRAVENOUS
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
## 2021-09-05 NOTE — Progress Notes (Signed)
eLink Physician-Brief Progress Note Patient Name: Frederick Castaneda DOB: [birth year] MRN: [id number]   Date of Service  09/05/2021  HPI/Events of Note  Pt on Precedex and fentanyl dris, but doesn't have PRN Fentanyl bolus for periods of agitation (during bath, turning)  eICU Interventions  Fentanyl 50 prn q2 hr ordered.      Intervention Category Intermediate Interventions: Pain - evaluation and management  Ranee Gosselin 09/05/2021, 1:12 AM
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
## 2021-09-05 NOTE — Progress Notes (Signed)
   VASCULAR SURGERY ASSESSMENT & PLAN:   ISCHEMIC RIGHT LEG: This patient is POD 2 after right femoral thrombectomy.  A large amount of fresh clot was retrieved from the femoral and popliteal artery.  However the tibial arteries were all chronically occluded and he has no options for revascularization on the right.    If he shows some neurologic improvement and remained stable, I could proceed with right above-the-knee amputation tomorrow. I will wait for CCM and Cardiology's input.    SUBJECTIVE:   Does not follow commands.  PHYSICAL EXAM:   Vitals:   09/05/21 0400 09/05/21 0441 09/05/21 0500 09/05/21 0600  BP: 95/76  108/83 108/82  Pulse: 75 73  86  Resp: _0 Temp: 99.9 F (37.7 C)     TempSrc: Bladder     SpO2: 100% 100%  97%  Weight:  110.9 kg    Height:       The right leg is cold. His groin incision looks fine.  LABS:   Lab Results  Component Value Date   WBC 12.1 (H) 09/05/2021   HGB 12.5 (L) 09/05/2021   HCT 38.1 (L) 09/05/2021   MCV 101.1 (H) 09/05/2021   PLT 96 (L) 09/05/2021   Lab Results  Component Value Date   CREATININE 1.30 (H) 09/05/2021   Lab Results  Component Value Date   INR 1.1 09/08/2021   CBG (last 3)  Recent Labs    09/04/21 1935 09/04/21 2336 09/05/21 0600  GLUCAP 105* 139* 82    PROBLEM LIST:    Principal Problem:   AMS (altered mental status)   CURRENT MEDS:    sodium chloride   Intravenous Once   acetaminophen  650 mg Per Tube Q6H   Or   acetaminophen  650 mg Rectal Q6H   aspirin  81 mg Per Tube Q0600   chlorhexidine gluconate (MEDLINE KIT)  15 mL Mouth Rinse BID   Chlorhexidine Gluconate Cloth  6 each Topical Q0600   feeding supplement (PROSource TF)  45 mL Per Tube BID   feeding supplement (VITAL HIGH PROTEIN)  1,000 mL Per Tube Q24H   gabapentin  300 mg Per Tube BID   insulin aspart  0-15 Units Subcutaneous Q4H   mouth rinse  15 mL Mouth Rinse 10 times per day   pantoprazole sodium  40 mg Per Tube  Daily   traMADol  50 mg Per Tube Q6H    Deitra Mayo Office: [phone] 09/05/2021

## 2021-09-05 NOTE — Progress Notes (Signed)
eLink Physician-Brief Progress Note Patient Name: Frederick Castaneda DOB: [birth year] MRN: [id number]   Date of Service  09/05/2021  HPI/Events of Note  Patient needs restraints order renewed, to prevent self-extubation.  eICU Interventions  Restraints ordered.        Thomasene Lot Lola Czerwonka 09/05/2021, 8:28 PM
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
## 2021-09-05 NOTE — Progress Notes (Signed)
Woke up this morning very agitated. With orientation and redirection he was able to calm down some, was able to squeeze fingers and move left leg on command. Was trying to talk around ETT. Vent changed to SBT 8/5 at 9:55-- RT notified.  Responded to versed 2mg  IV push, still tolerating SBT after versed.  , DO 09/05/21 10:02 AM [facility]
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
## 2021-09-05 NOTE — Progress Notes (Signed)
Consent for right AKA signed and in patient's chart.

## 2021-09-05 NOTE — Progress Notes (Signed)
VASCULAR SURGERY:  ADDENDUM:  Critical care medicine and cardiology are in agreement with proceeding with right above-the-knee amputation tomorrow.  I have discussed this with the patient's brother.  He is scheduled for surgery tomorrow.  Preop orders have been written.    Cari Caraway, MD 10:17 AM

## 2021-09-05 NOTE — H&P (View-Only) (Signed)
VASCULAR SURGERY:  ADDENDUM:  Critical care medicine and cardiology are in agreement with proceeding with right above-the-knee amputation tomorrow.  I have discussed this with the patient's brother.  He is scheduled for surgery tomorrow.  Preop orders have been written.    Cari Caraway, MD 10:17 AM

## 2021-09-06 ENCOUNTER — Encounter (HOSPITAL_COMMUNITY): Admission: EM | Disposition: E | Payer: Self-pay | Source: Home / Self Care | Attending: Critical Care Medicine

## 2021-09-06 ENCOUNTER — Inpatient Hospital Stay (HOSPITAL_COMMUNITY): Payer: 59 | Admitting: Certified Registered Nurse Anesthetist

## 2021-09-06 ENCOUNTER — Encounter (HOSPITAL_COMMUNITY): Payer: Self-pay | Admitting: Pulmonary Disease

## 2021-09-06 DIAGNOSIS — J9601 Acute respiratory failure with hypoxia: Secondary | ICD-10-CM | POA: Diagnosis not present

## 2021-09-06 DIAGNOSIS — Z9911 Dependence on respirator [ventilator] status: Secondary | ICD-10-CM | POA: Diagnosis not present

## 2021-09-06 DIAGNOSIS — R4182 Altered mental status, unspecified: Secondary | ICD-10-CM | POA: Diagnosis not present

## 2021-09-06 DIAGNOSIS — I70221 Atherosclerosis of native arteries of extremities with rest pain, right leg: Secondary | ICD-10-CM

## 2021-09-06 HISTORY — PX: AMPUTATION: SHX166

## 2021-09-06 LAB — POCT I-STAT 7, (LYTES, BLD GAS, ICA,H+H)
Acid-Base Excess: 1 mmol/L (ref 0.0–2.0)
Bicarbonate: 26.8 mmol/L (ref 20.0–28.0)
Calcium, Ion: 1.16 mmol/L (ref 1.15–1.40)
HCT: 36 % — ABNORMAL LOW (ref 39.0–52.0)
Hemoglobin: 12.2 g/dL — ABNORMAL LOW (ref 13.0–17.0)
O2 Saturation: 100 %
Patient temperature: 34.7
Potassium: 3.6 mmol/L (ref 3.5–5.1)
Sodium: 140 mmol/L (ref 135–145)
TCO2: 28 mmol/L (ref 22–32)
pCO2 arterial: 40.9 mmHg (ref 32.0–48.0)
pH, Arterial: 7.414 (ref 7.350–7.450)
pO2, Arterial: 272 mmHg — ABNORMAL HIGH (ref 83.0–108.0)

## 2021-09-06 LAB — CULTURE, BLOOD (ROUTINE X 2)
Culture: NO GROWTH
Culture: NO GROWTH
Special Requests: ADEQUATE

## 2021-09-06 LAB — CBC
HCT: 36.6 % — ABNORMAL LOW (ref 39.0–52.0)
Hemoglobin: 12 g/dL — ABNORMAL LOW (ref 13.0–17.0)
MCH: 33.3 pg (ref 26.0–34.0)
MCHC: 32.8 g/dL (ref 30.0–36.0)
MCV: 101.7 fL — ABNORMAL HIGH (ref 80.0–100.0)
Platelets: 110 10*3/uL — ABNORMAL LOW (ref 150–400)
RBC: 3.6 MIL/uL — ABNORMAL LOW (ref 4.22–5.81)
RDW: 14.2 % (ref 11.5–15.5)
WBC: 11.9 10*3/uL — ABNORMAL HIGH (ref 4.0–10.5)
nRBC: 0 % (ref 0.0–0.2)

## 2021-09-06 LAB — SURGICAL PATHOLOGY

## 2021-09-06 LAB — COOXEMETRY PANEL
Carboxyhemoglobin: 1.4 % (ref 0.5–1.5)
Methemoglobin: 0.7 % (ref 0.0–1.5)
O2 Saturation: 59.4 %
Total hemoglobin: 11.9 g/dL — ABNORMAL LOW (ref 12.0–16.0)

## 2021-09-06 LAB — GLUCOSE, CAPILLARY
Glucose-Capillary: 101 mg/dL — ABNORMAL HIGH (ref 70–99)
Glucose-Capillary: 105 mg/dL — ABNORMAL HIGH (ref 70–99)
Glucose-Capillary: 109 mg/dL — ABNORMAL HIGH (ref 70–99)
Glucose-Capillary: 114 mg/dL — ABNORMAL HIGH (ref 70–99)
Glucose-Capillary: 120 mg/dL — ABNORMAL HIGH (ref 70–99)
Glucose-Capillary: 132 mg/dL — ABNORMAL HIGH (ref 70–99)

## 2021-09-06 LAB — BASIC METABOLIC PANEL
Anion gap: 8 (ref 5–15)
BUN: 27 mg/dL — ABNORMAL HIGH (ref 8–23)
CO2: 23 mmol/L (ref 22–32)
Calcium: 8 mg/dL — ABNORMAL LOW (ref 8.9–10.3)
Chloride: 107 mmol/L (ref 98–111)
Creatinine, Ser: 1.17 mg/dL (ref 0.61–1.24)
GFR, Estimated: >60 mL/min (ref >60)
Glucose, Bld: 119 mg/dL — ABNORMAL HIGH (ref 70–99)
Potassium: 3.8 mmol/L (ref 3.5–5.1)
Sodium: 138 mmol/L (ref 135–145)

## 2021-09-06 LAB — HEPARIN LEVEL (UNFRACTIONATED): Heparin Unfractionated: 0.31 IU/mL (ref 0.30–0.70)

## 2021-09-06 SURGERY — AMPUTATION, ABOVE KNEE
Anesthesia: General | Site: Knee | Laterality: Right

## 2021-09-06 MED ORDER — FENTANYL CITRATE (PF) 250 MCG/5ML IJ SOLN
INTRAMUSCULAR | Status: AC
  Filled 2021-09-06: qty 5

## 2021-09-06 MED ORDER — MIDAZOLAM HCL 2 MG/2ML IJ SOLN
1.0000 mg | INTRAMUSCULAR | Status: DC | PRN
Start: 2021-09-06 — End: 2021-09-14
  Administered 2021-09-06 – 2021-09-11 (×22): 2 mg via INTRAVENOUS
  Filled 2021-09-06 (×23): qty 2

## 2021-09-06 MED ORDER — SODIUM CHLORIDE 0.9 % IV SOLN: INTRAVENOUS | Status: DC

## 2021-09-06 MED ORDER — PROPOFOL 10 MG/ML IV BOLUS
INTRAVENOUS | Status: AC
  Filled 2021-09-06: qty 20

## 2021-09-06 MED ORDER — POTASSIUM CHLORIDE 20 MEQ PO PACK
40.0000 meq | PACK | Freq: Once | ORAL | Status: AC
  Administered 2021-09-06: 40 meq
  Filled 2021-09-06: qty 2

## 2021-09-06 MED ORDER — ROCURONIUM BROMIDE 10 MG/ML (PF) SYRINGE
PREFILLED_SYRINGE | INTRAVENOUS | Status: DC | PRN
  Administered 2021-09-06: 50 mg via INTRAVENOUS

## 2021-09-06 MED ORDER — LIDOCAINE 2% (20 MG/ML) 5 ML SYRINGE
INTRAMUSCULAR | Status: AC
  Filled 2021-09-06: qty 5

## 2021-09-06 MED ORDER — BACITRACIN ZINC 500 UNIT/GM EX OINT
TOPICAL_OINTMENT | CUTANEOUS | Status: AC
  Filled 2021-09-06: qty 28.35

## 2021-09-06 MED ORDER — SODIUM CHLORIDE 0.9% FLUSH: 3.0000 mL | INTRAVENOUS | Status: DC | PRN

## 2021-09-06 MED ORDER — SODIUM CHLORIDE 0.9 % IV SOLN
3.0000 g | INTRAVENOUS | Status: AC
  Administered 2021-09-06: 3 g via INTRAVENOUS
  Filled 2021-09-06: qty 3

## 2021-09-06 MED ORDER — ASPIRIN 81 MG PO CHEW: 81.0000 mg | CHEWABLE_TABLET | ORAL | Status: AC

## 2021-09-06 MED ORDER — POTASSIUM CHLORIDE 20 MEQ PO PACK
40.0000 meq | PACK | Freq: Once | ORAL | Status: AC
  Administered 2021-09-06: 40 meq

## 2021-09-06 MED ORDER — BACITRACIN ZINC 500 UNIT/GM EX OINT
TOPICAL_OINTMENT | CUTANEOUS | Status: DC | PRN
  Administered 2021-09-06: 1 via TOPICAL

## 2021-09-06 MED ORDER — SODIUM CHLORIDE 0.9% FLUSH
3.0000 mL | Freq: Two times a day (BID) | INTRAVENOUS | Status: DC
  Administered 2021-09-06 – 2021-09-13 (×12): 3 mL via INTRAVENOUS

## 2021-09-06 MED ORDER — PHENYLEPHRINE 40 MCG/ML (10ML) SYRINGE FOR IV PUSH (FOR BLOOD PRESSURE SUPPORT)
PREFILLED_SYRINGE | INTRAVENOUS | Status: DC | PRN
  Administered 2021-09-06: 80 ug via INTRAVENOUS
  Administered 2021-09-06: 120 ug via INTRAVENOUS
  Administered 2021-09-06: 80 ug via INTRAVENOUS

## 2021-09-06 MED ORDER — BISACODYL 10 MG RE SUPP
10.0000 mg | Freq: Once | RECTAL | Status: AC
  Administered 2021-09-06: 10 mg via RECTAL
  Filled 2021-09-06: qty 1

## 2021-09-06 MED ORDER — ROCURONIUM BROMIDE 10 MG/ML (PF) SYRINGE
PREFILLED_SYRINGE | INTRAVENOUS | Status: AC
  Filled 2021-09-06: qty 10

## 2021-09-06 MED ORDER — POTASSIUM CHLORIDE 20 MEQ PO PACK
40.0000 meq | PACK | Freq: Two times a day (BID) | ORAL | Status: DC
  Filled 2021-09-06: qty 2

## 2021-09-06 MED ORDER — SORBITOL 70 % SOLN
30.0000 mL | Freq: Once | Status: AC
Start: 2021-09-06 — End: 2021-09-06
  Administered 2021-09-06: 30 mL
  Filled 2021-09-06: qty 30

## 2021-09-06 MED ORDER — 0.9 % SODIUM CHLORIDE (POUR BTL) OPTIME
TOPICAL | Status: DC | PRN
  Administered 2021-09-06: 1000 mL

## 2021-09-06 MED ORDER — HEPARIN (PORCINE) 25000 UT/250ML-% IV SOLN
2250.0000 [IU]/h | INTRAVENOUS | Status: DC
  Administered 2021-09-06 – 2021-09-07 (×2): 2100 [IU]/h via INTRAVENOUS
  Filled 2021-09-06 (×2): qty 250

## 2021-09-06 MED ORDER — LACTATED RINGERS IV SOLN: INTRAVENOUS | Status: DC | PRN

## 2021-09-06 MED ORDER — MIDAZOLAM HCL 2 MG/2ML IJ SOLN
INTRAMUSCULAR | Status: AC
  Filled 2021-09-06: qty 2

## 2021-09-06 MED ORDER — SODIUM CHLORIDE 0.9 % IV SOLN: 250.0000 mL | INTRAVENOUS | Status: DC | PRN

## 2021-09-06 SURGICAL SUPPLY — 61 items
BAG COUNTER SPONGE SURGICOUNT (BAG) ×2 IMPLANT
BAG SPNG CNTER NS LX DISP (BAG) ×1
BAG SURGICOUNT SPONGE COUNTING (BAG) ×1
BANDAGE ESMARK 6X9 LF (GAUZE/BANDAGES/DRESSINGS) ×1 IMPLANT
BLADE SAW RECIP 87.9 MT (BLADE) ×3 IMPLANT
BNDG CMPR 9X6 STRL LF SNTH (GAUZE/BANDAGES/DRESSINGS) ×1
BNDG CMPR MED 10X6 ELC LF (GAUZE/BANDAGES/DRESSINGS) ×1
BNDG COHESIVE 6X5 TAN ST LF (GAUZE/BANDAGES/DRESSINGS) ×2 IMPLANT
BNDG COHESIVE 6X5 TAN STRL LF (GAUZE/BANDAGES/DRESSINGS) ×3 IMPLANT
BNDG ELASTIC 4X5.8 VLCR STR LF (GAUZE/BANDAGES/DRESSINGS) ×4 IMPLANT
BNDG ELASTIC 6X10 VLCR STRL LF (GAUZE/BANDAGES/DRESSINGS) ×2 IMPLANT
BNDG ESMARK 6X9 LF (GAUZE/BANDAGES/DRESSINGS) ×3
BNDG GAUZE ELAST 4 BULKY (GAUZE/BANDAGES/DRESSINGS) ×5 IMPLANT
CANISTER SUCT 3000ML PPV (MISCELLANEOUS) ×3 IMPLANT
COVER SURGICAL LIGHT HANDLE (MISCELLANEOUS) ×3 IMPLANT
CUFF TOURN SGL QUICK 18X4 (TOURNIQUET CUFF) IMPLANT
CUFF TOURN SGL QUICK 24 (TOURNIQUET CUFF)
CUFF TOURN SGL QUICK 34 (TOURNIQUET CUFF) ×3
CUFF TOURN SGL QUICK 42 (TOURNIQUET CUFF) IMPLANT
CUFF TRNQT CYL 24X4X16.5-23 (TOURNIQUET CUFF) IMPLANT
CUFF TRNQT CYL 34X4.125X (TOURNIQUET CUFF) IMPLANT
CUFF TRNQT CYL 34X4X40X1 (TOURNIQUET CUFF) IMPLANT
DRAIN CHANNEL 19F RND (DRAIN) IMPLANT
DRAPE HALF SHEET 40X57 (DRAPES) ×3 IMPLANT
DRAPE INCISE IOBAN 66X45 STRL (DRAPES) ×3 IMPLANT
DRAPE ORTHO SPLIT 77X108 STRL (DRAPES) ×6
DRAPE SURG ORHT 6 SPLT 77X108 (DRAPES) ×2 IMPLANT
DRAPE U-SHAPE 47X51 STRL (DRAPES) ×3 IMPLANT
DRSG ADAPTIC 3X8 NADH LF (GAUZE/BANDAGES/DRESSINGS) ×3 IMPLANT
ELECT CAUTERY BLADE 6.4 (BLADE) ×3 IMPLANT
ELECT REM PT RETURN 9FT ADLT (ELECTROSURGICAL) ×3
ELECTRODE REM PT RTRN 9FT ADLT (ELECTROSURGICAL) ×1 IMPLANT
EVACUATOR SILICONE 100CC (DRAIN) IMPLANT
GAUZE SPONGE 4X4 12PLY STRL (GAUZE/BANDAGES/DRESSINGS) ×3 IMPLANT
GLOVE SRG 8 PF TXTR STRL LF DI (GLOVE) ×1 IMPLANT
GLOVE SURG ENC MOIS LTX SZ7.5 (GLOVE) ×3 IMPLANT
GLOVE SURG POLY ORTHO LF SZ7.5 (GLOVE) IMPLANT
GLOVE SURG UNDER LTX SZ8 (GLOVE) ×5 IMPLANT
GLOVE SURG UNDER POLY LF SZ8 (GLOVE) ×3
GOWN STRL REUS W/ TWL LRG LVL3 (GOWN DISPOSABLE) ×3 IMPLANT
GOWN STRL REUS W/TWL LRG LVL3 (GOWN DISPOSABLE) ×9
KIT BASIN OR (CUSTOM PROCEDURE TRAY) ×3 IMPLANT
KIT TURNOVER KIT B (KITS) ×3 IMPLANT
NS IRRIG 1000ML POUR BTL (IV SOLUTION) ×3 IMPLANT
PACK GENERAL/GYN (CUSTOM PROCEDURE TRAY) ×3 IMPLANT
PAD ARMBOARD 7.5X6 YLW CONV (MISCELLANEOUS) ×6 IMPLANT
RASP HELIOCORDIAL MED (MISCELLANEOUS) IMPLANT
STAPLER VISISTAT 35W (STAPLE) ×3 IMPLANT
STOCKINETTE IMPERVIOUS LG (DRAPES) ×3 IMPLANT
SUT ETHILON 3 0 PS 1 (SUTURE) IMPLANT
SUT SILK 0 TIES 10X30 (SUTURE) ×3 IMPLANT
SUT SILK 2 0 (SUTURE) ×6
SUT SILK 2 0 SH CR/8 (SUTURE) ×3 IMPLANT
SUT SILK 2-0 18XBRD TIE 12 (SUTURE) ×1 IMPLANT
SUT SILK 3 0 (SUTURE) ×3
SUT SILK 3-0 18XBRD TIE 12 (SUTURE) ×1 IMPLANT
SUT VIC AB 2-0 CT1 18 (SUTURE) ×5 IMPLANT
TOWEL GREEN STERILE (TOWEL DISPOSABLE) ×6 IMPLANT
TOWEL GREEN STERILE FF (TOWEL DISPOSABLE) ×3 IMPLANT
UNDERPAD 30X36 HEAVY ABSORB (UNDERPADS AND DIAPERS) ×3 IMPLANT
WATER STERILE IRR 1000ML POUR (IV SOLUTION) ×3 IMPLANT

## 2021-09-06 NOTE — Progress Notes (Signed)
eLink Physician-Brief Progress Note Patient Name: Frederick Castaneda DOB: 09-07-1961 MRN: [id number]   Date of Service  09-10-2021  HPI/Events of Note  Agitation   eICU Interventions  Plan: Increase Versed dose to 1-2 mg IV Q 2 hours PRN agitation or sedation.      Intervention Category Major Interventions: Delirium, psychosis, severe agitation - evaluation and management  Tal Neer Eugene Sep 10, 2021, 7:59 PM

## 2021-09-06 NOTE — Transfer of Care (Signed)
Immediate Anesthesia Transfer of Care Note  Patient: Frederick Castaneda  Procedure(s) Performed: RIGHT ABOVE KNEE AMPUTATION (Right: Knee)  Patient Location: ICU  Anesthesia Type:General  Level of Consciousness: sedated, unresponsive and Patient remains intubated per anesthesia plan  Airway & Oxygen Therapy: Patient remains intubated per anesthesia plan and Patient placed on Ventilator (see vital sign flow sheet for setting)  Post-op Assessment: Report given to RN and Post -op Vital signs reviewed and stable  Post vital signs: Reviewed and stable  Last Vitals:  Vitals Value Taken Time  BP 132/78   Temp    Pulse 78   Resp 16   SpO2 97     Last Pain:  Vitals:   2021/09/22 0914  TempSrc: Bladder  PainSc: Asleep         Complications: No notable events documented.
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
## 2021-09-06 NOTE — Progress Notes (Signed)
Pt just received back from the OR s/p AKA.  Will hold CPT, RN getting pt settled.
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
## 2021-09-06 NOTE — Interval H&P Note (Signed)
History and Physical Interval Note:  09/19/2021 10:03 AM  Frederick Castaneda  has presented today for surgery, with the diagnosis of Critical limb ischemia of right leg.  The various methods of treatment have been discussed with the patient and family. After consideration of risks, benefits and other options for treatment, the patient has consented to  Procedure(s): RIGHT ABOVE KNEE AMPUTATION (Right) as a surgical intervention.  The patient's history has been reviewed, patient examined, no change in status, stable for surgery.  I have reviewed the patient's chart and labs.  Questions were answered to the patient's satisfaction.     Waverly Ferrari

## 2021-09-06 NOTE — Progress Notes (Addendum)
   Plan for [facility] tomorrow by Dr Gala Romney at 08:30   Tonye Becket NP-C   1:00 PM  Dr Gala Romney call and discussed with his brother Nirvaan Frett per phone.   He verbalized understanding and agreed to the procedure.   Usman Millett NP-C  5:26 PM
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
## 2021-09-06 NOTE — Anesthesia Procedure Notes (Signed)
Date/Time: 09/27/2021 10:39 AM Performed by: Nils Pyle, CRNA Pre-anesthesia Checklist: Patient identified, Emergency Drugs available, Suction available and Patient being monitored Patient Re-evaluated:Patient Re-evaluated prior to induction Oxygen Delivery Method: Circle system utilized Preoxygenation: Pre-oxygenation with 100% oxygen Induction Type: Inhalational induction with existing ETT Placement Confirmation: positive ETCO2 and breath sounds checked- equal and bilateral Dental Injury: Teeth and Oropharynx as per pre-operative assessment

## 2021-09-07 ENCOUNTER — Encounter (HOSPITAL_COMMUNITY): Payer: Self-pay | Admitting: Vascular Surgery

## 2021-09-07 ENCOUNTER — Encounter (HOSPITAL_COMMUNITY): Admission: EM | Disposition: E | Payer: Self-pay | Source: Home / Self Care | Attending: Critical Care Medicine

## 2021-09-07 DIAGNOSIS — I469 Cardiac arrest, cause unspecified: Secondary | ICD-10-CM

## 2021-09-07 DIAGNOSIS — Z89611 Acquired absence of right leg above knee: Secondary | ICD-10-CM

## 2021-09-07 DIAGNOSIS — I2510 Atherosclerotic heart disease of native coronary artery without angina pectoris: Secondary | ICD-10-CM

## 2021-09-07 DIAGNOSIS — J9601 Acute respiratory failure with hypoxia: Secondary | ICD-10-CM | POA: Diagnosis not present

## 2021-09-07 DIAGNOSIS — Z9911 Dependence on respirator [ventilator] status: Secondary | ICD-10-CM | POA: Diagnosis not present

## 2021-09-07 DIAGNOSIS — R4182 Altered mental status, unspecified: Secondary | ICD-10-CM | POA: Diagnosis not present

## 2021-09-07 HISTORY — PX: RIGHT/LEFT HEART CATH AND CORONARY ANGIOGRAPHY: CATH118266

## 2021-09-07 LAB — CBC
HCT: 36.8 % — ABNORMAL LOW (ref 39.0–52.0)
Hemoglobin: 12.1 g/dL — ABNORMAL LOW (ref 13.0–17.0)
MCH: 33.5 pg (ref 26.0–34.0)
MCHC: 32.9 g/dL (ref 30.0–36.0)
MCV: 101.9 fL — ABNORMAL HIGH (ref 80.0–100.0)
Platelets: 131 10*3/uL — ABNORMAL LOW (ref 150–400)
RBC: 3.61 MIL/uL — ABNORMAL LOW (ref 4.22–5.81)
RDW: 14.5 % (ref 11.5–15.5)
WBC: 11.8 10*3/uL — ABNORMAL HIGH (ref 4.0–10.5)
nRBC: 0 % (ref 0.0–0.2)

## 2021-09-07 LAB — POCT I-STAT 7, (LYTES, BLD GAS, ICA,H+H)
Acid-Base Excess: 3 mmol/L — ABNORMAL HIGH (ref 0.0–2.0)
Bicarbonate: 28.7 mmol/L — ABNORMAL HIGH (ref 20.0–28.0)
Calcium, Ion: 1.16 mmol/L (ref 1.15–1.40)
HCT: 36 % — ABNORMAL LOW (ref 39.0–52.0)
Hemoglobin: 12.2 g/dL — ABNORMAL LOW (ref 13.0–17.0)
O2 Saturation: 100 %
Potassium: 3.7 mmol/L (ref 3.5–5.1)
Sodium: 143 mmol/L (ref 135–145)
TCO2: 30 mmol/L (ref 22–32)
pCO2 arterial: 46.3 mmHg (ref 32.0–48.0)
pH, Arterial: 7.4 (ref 7.350–7.450)
pO2, Arterial: 184 mmHg — ABNORMAL HIGH (ref 83.0–108.0)

## 2021-09-07 LAB — BASIC METABOLIC PANEL
Anion gap: 8 (ref 5–15)
Anion gap: 12 (ref 5–15)
BUN: 23 mg/dL (ref 8–23)
BUN: 20 mg/dL (ref 8–23)
CO2: 25 mmol/L (ref 22–32)
CO2: 25 mmol/L (ref 22–32)
Calcium: 8.2 mg/dL — ABNORMAL LOW (ref 8.9–10.3)
Calcium: 8.6 mg/dL — ABNORMAL LOW (ref 8.9–10.3)
Chloride: 106 mmol/L (ref 98–111)
Chloride: 104 mmol/L (ref 98–111)
Creatinine, Ser: 0.94 mg/dL (ref 0.61–1.24)
Creatinine, Ser: 0.93 mg/dL (ref 0.61–1.24)
GFR, Estimated: >60 mL/min (ref >60)
GFR, Estimated: >60 mL/min (ref >60)
Glucose, Bld: 149 mg/dL — ABNORMAL HIGH (ref 70–99)
Glucose, Bld: 149 mg/dL — ABNORMAL HIGH (ref 70–99)
Potassium: 3.8 mmol/L (ref 3.5–5.1)
Potassium: 3.8 mmol/L (ref 3.5–5.1)
Sodium: 139 mmol/L (ref 135–145)
Sodium: 141 mmol/L (ref 135–145)

## 2021-09-07 LAB — POCT I-STAT EG7
Acid-base deficit: 1 mmol/L (ref 0.0–2.0)
Bicarbonate: 25.6 mmol/L (ref 20.0–28.0)
Calcium, Ion: 0.98 mmol/L — ABNORMAL LOW (ref 1.15–1.40)
HCT: 33 % — ABNORMAL LOW (ref 39.0–52.0)
Hemoglobin: 11.2 g/dL — ABNORMAL LOW (ref 13.0–17.0)
O2 Saturation: 65 %
Potassium: 3 mmol/L — ABNORMAL LOW (ref 3.5–5.1)
Sodium: 146 mmol/L — ABNORMAL HIGH (ref 135–145)
TCO2: 27 mmol/L (ref 22–32)
pCO2, Ven: 47.5 mmHg (ref 44.0–60.0)
pH, Ven: 7.34 (ref 7.250–7.430)
pO2, Ven: 36 mmHg (ref 32.0–45.0)

## 2021-09-07 LAB — GLUCOSE, CAPILLARY
Glucose-Capillary: 107 mg/dL — ABNORMAL HIGH (ref 70–99)
Glucose-Capillary: 122 mg/dL — ABNORMAL HIGH (ref 70–99)
Glucose-Capillary: 137 mg/dL — ABNORMAL HIGH (ref 70–99)
Glucose-Capillary: 139 mg/dL — ABNORMAL HIGH (ref 70–99)
Glucose-Capillary: 148 mg/dL — ABNORMAL HIGH (ref 70–99)
Glucose-Capillary: 90 mg/dL (ref 70–99)

## 2021-09-07 LAB — COOXEMETRY PANEL
Carboxyhemoglobin: 1.5 % (ref 0.5–1.5)
Methemoglobin: 0.7 % (ref 0.0–1.5)
O2 Saturation: 62.1 %
Total hemoglobin: 12.2 g/dL (ref 12.0–16.0)

## 2021-09-07 LAB — MAGNESIUM
Magnesium: 2 mg/dL (ref 1.7–2.4)
Magnesium: 2 mg/dL (ref 1.7–2.4)

## 2021-09-07 LAB — HEPARIN LEVEL (UNFRACTIONATED): Heparin Unfractionated: 0.29 IU/mL — ABNORMAL LOW (ref 0.30–0.70)

## 2021-09-07 LAB — SURGICAL PATHOLOGY

## 2021-09-07 SURGERY — RIGHT/LEFT HEART CATH AND CORONARY ANGIOGRAPHY
Anesthesia: LOCAL

## 2021-09-07 MED ORDER — ONDANSETRON HCL 4 MG/2ML IJ SOLN: 4.0000 mg | Freq: Four times a day (QID) | INTRAMUSCULAR | Status: DC | PRN

## 2021-09-07 MED ORDER — HEPARIN (PORCINE) IN NACL 2000-0.9 UNIT/L-% IV SOLN
INTRAVENOUS | Status: DC | PRN
  Administered 2021-09-07: 1000 mL

## 2021-09-07 MED ORDER — FUROSEMIDE 10 MG/ML IJ SOLN
80.0000 mg | Freq: Three times a day (TID) | INTRAMUSCULAR | Status: DC
  Administered 2021-09-07 – 2021-09-11 (×11): 80 mg via INTRAVENOUS
  Filled 2021-09-07 (×12): qty 8

## 2021-09-07 MED ORDER — MIDAZOLAM HCL 2 MG/2ML IJ SOLN
INTRAMUSCULAR | Status: DC | PRN
  Administered 2021-09-07: 2 mg via INTRAVENOUS

## 2021-09-07 MED ORDER — FUROSEMIDE 10 MG/ML IJ SOLN: 80.0000 mg | Freq: Two times a day (BID) | INTRAMUSCULAR | Status: DC

## 2021-09-07 MED ORDER — LIDOCAINE HCL (PF) 1 % IJ SOLN
INTRAMUSCULAR | Status: AC
  Filled 2021-09-07: qty 30

## 2021-09-07 MED ORDER — HEPARIN SODIUM (PORCINE) 1000 UNIT/ML IJ SOLN
INTRAMUSCULAR | Status: AC
  Filled 2021-09-07: qty 10

## 2021-09-07 MED ORDER — SODIUM CHLORIDE 0.9% FLUSH
3.0000 mL | Freq: Two times a day (BID) | INTRAVENOUS | Status: DC
  Administered 2021-09-07 – 2021-09-13 (×12): 3 mL via INTRAVENOUS

## 2021-09-07 MED ORDER — POTASSIUM CHLORIDE 20 MEQ PO PACK
40.0000 meq | PACK | Freq: Two times a day (BID) | ORAL | Status: AC
  Administered 2021-09-07: 40 meq
  Filled 2021-09-07: qty 2

## 2021-09-07 MED ORDER — SODIUM CHLORIDE 0.9 % IV SOLN
250.0000 mL | INTRAVENOUS | Status: DC | PRN
  Administered 2021-09-08: 250 mL via INTRAVENOUS

## 2021-09-07 MED ORDER — MIDAZOLAM HCL 2 MG/2ML IJ SOLN
INTRAMUSCULAR | Status: AC
  Filled 2021-09-07: qty 2

## 2021-09-07 MED ORDER — HEPARIN (PORCINE) IN NACL 1000-0.9 UT/500ML-% IV SOLN
INTRAVENOUS | Status: AC
  Filled 2021-09-07: qty 1000

## 2021-09-07 MED ORDER — VERAPAMIL HCL 2.5 MG/ML IV SOLN
INTRAVENOUS | Status: AC
  Filled 2021-09-07: qty 2

## 2021-09-07 MED ORDER — ACETAMINOPHEN 325 MG PO TABS: 650.0000 mg | ORAL_TABLET | ORAL | Status: DC | PRN

## 2021-09-07 MED ORDER — LIDOCAINE HCL (PF) 1 % IJ SOLN
INTRAMUSCULAR | Status: DC | PRN
  Administered 2021-09-07: 4 mL

## 2021-09-07 MED ORDER — HEPARIN (PORCINE) 25000 UT/250ML-% IV SOLN
2300.0000 [IU]/h | INTRAVENOUS | Status: DC
  Administered 2021-09-07: 2250 [IU]/h via INTRAVENOUS
  Administered 2021-09-08: 2300 [IU]/h via INTRAVENOUS
  Administered 2021-09-08: 2250 [IU]/h via INTRAVENOUS
  Administered 2021-09-09 – 2021-09-13 (×10): 2300 [IU]/h via INTRAVENOUS
  Filled 2021-09-07 (×13): qty 250

## 2021-09-07 MED ORDER — SODIUM CHLORIDE 0.9% FLUSH: 3.0000 mL | INTRAVENOUS | Status: DC | PRN

## 2021-09-07 MED ORDER — HEPARIN SODIUM (PORCINE) 1000 UNIT/ML IJ SOLN
INTRAMUSCULAR | Status: DC | PRN
  Administered 2021-09-07: 4000 [IU] via INTRAVENOUS

## 2021-09-07 MED ORDER — ATORVASTATIN CALCIUM 40 MG PO TABS
40.0000 mg | ORAL_TABLET | Freq: Every day | ORAL | Status: DC
Start: 2021-09-07 — End: 2021-09-13
  Administered 2021-09-07 – 2021-09-13 (×6): 40 mg
  Filled 2021-09-07 (×6): qty 1

## 2021-09-07 MED ORDER — VERAPAMIL HCL 2.5 MG/ML IV SOLN
INTRAVENOUS | Status: DC | PRN
  Administered 2021-09-07: 10 mL via INTRA_ARTERIAL

## 2021-09-07 MED ORDER — LABETALOL HCL 5 MG/ML IV SOLN: 10.0000 mg | INTRAVENOUS | Status: AC | PRN

## 2021-09-07 MED ORDER — HYDRALAZINE HCL 20 MG/ML IJ SOLN: 10.0000 mg | INTRAMUSCULAR | Status: AC | PRN

## 2021-09-07 MED ORDER — FENTANYL CITRATE (PF) 100 MCG/2ML IJ SOLN
INTRAMUSCULAR | Status: AC
  Filled 2021-09-07: qty 2

## 2021-09-07 MED ORDER — IOHEXOL 350 MG/ML SOLN
INTRAVENOUS | Status: DC | PRN
  Administered 2021-09-07: 45 mL

## 2021-09-07 MED ORDER — FENTANYL CITRATE (PF) 100 MCG/2ML IJ SOLN
INTRAMUSCULAR | Status: DC | PRN
  Administered 2021-09-07: 50 ug via INTRAVENOUS

## 2021-09-07 SURGICAL SUPPLY — 14 items
CATH BALLN WEDGE 5F 110CM (CATHETERS) ×3 IMPLANT
CATH INFINITI 5 FR JL3.5 (CATHETERS) ×3 IMPLANT
CATH INFINITI 5FR MULTPACK ANG (CATHETERS) ×3 IMPLANT
DEVICE RAD COMP TR BAND LRG (VASCULAR PRODUCTS) ×3 IMPLANT
GLIDESHEATH SLEND SS 6F .021 (SHEATH) ×3 IMPLANT
GUIDEWIRE INQWIRE 1.5J.035X260 (WIRE) ×1 IMPLANT
INQWIRE 1.5J .035X260CM (WIRE) ×3
MAT PREVALON FULL STRYKER (MISCELLANEOUS) ×3 IMPLANT
PACK CARDIAC CATHETERIZATION (CUSTOM PROCEDURE TRAY) ×3 IMPLANT
SHEATH GLIDE SLENDER 4/5FR (SHEATH) ×3 IMPLANT
SHEATH PINNACLE 5F 10CM (SHEATH) ×3 IMPLANT
SHEATH PROBE COVER 6X72 (BAG) ×3 IMPLANT
TRANSDUCER W/STOPCOCK (MISCELLANEOUS) ×3 IMPLANT
WIRE EMERALD 3MM-J .035X150CM (WIRE) ×3 IMPLANT

## 2021-09-07 NOTE — Progress Notes (Signed)
Updated niece about pt condition.  She would like to be called and updated by rounding MD in the AM.
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
## 2021-09-07 NOTE — Anesthesia Postprocedure Evaluation (Signed)
Anesthesia Post Note  Patient: Frederick Castaneda  Procedure(s) Performed: RIGHT ABOVE KNEE AMPUTATION (Right: Knee)     Patient location during evaluation: PACU Anesthesia Type: General Level of consciousness: awake and alert Pain management: pain level controlled Vital Signs Assessment: post-procedure vital signs reviewed and stable Respiratory status: spontaneous breathing, nonlabored ventilation, respiratory function stable and patient connected to nasal cannula oxygen Cardiovascular status: blood pressure returned to baseline and stable Postop Assessment: no apparent nausea or vomiting Anesthetic complications: no   No notable events documented.  Last Vitals:  Vitals:   09/28/2021 1200 09/13/2021 1208  BP: (!) 109/59   Pulse: 76   Resp: 18   Temp: 37.7 C   SpO2: 96% 93%    Last Pain:  Vitals:   09/29/2021 1200  TempSrc: Bladder  PainSc:                  [name]

## 2021-09-07 NOTE — Interval H&P Note (Signed)
History and Physical Interval Note:  09/11/2021 9:00 AM  Frederick Castaneda  has presented today for surgery, with the diagnosis of heart failure.  The various methods of treatment have been discussed with the patient and family. After consideration of risks, benefits and other options for treatment, the patient has consented to  Procedure(s): RIGHT/LEFT HEART CATH AND CORONARY ANGIOGRAPHY (N/A) and possible coronary angioplasty as a surgical intervention.  The patient's history has been reviewed, patient examined, no change in status, stable for surgery.  I have reviewed the patient's chart and labs.  Questions were answered to the patient's satisfaction.     Desha Bitner
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
## 2021-09-07 NOTE — Progress Notes (Signed)
   VASCULAR SURGERY ASSESSMENT & PLAN:   POD 1 RIGHT AKA: The dressing is dry.  Plan dressing change tomorrow.  The tissue appeared adequately perfused at this level.  ANTICOAGULATION: He is back on IV heparin.  SUBJECTIVE:   Moves all extremities but does not follow commands.  PHYSICAL EXAM:   Vitals:   09/22/2021 0304 09/26/2021 0400 09/30/2021 0500 09/28/2021 0600  BP:  94/67 95/72 103/72  Pulse: 65 63 65 64  Resp: 18 18 18 18  Temp:   (!) 97 F (36.1 C)   TempSrc:   Bladder   SpO2: 100% 97% 95% 97%  Weight:   104.4 kg   Height:       The dressing on his right AKA is dry.  LABS:   Lab Results  Component Value Date   WBC 11.8 (H) 09/27/2021   HGB 12.1 (L) 09/27/2021   HCT 36.8 (L) 09/13/2021   MCV 101.9 (H) 09/05/2021   PLT 131 (L) 09/16/2021   Lab Results  Component Value Date   CREATININE 0.94 09/26/2021   CBG (last 3)  Recent Labs    09/15/2021 2045 09/02/2021 0008 09/02/2021 0459  GLUCAP 109* 139* 137*    PROBLEM LIST:    Principal Problem:   AMS (altered mental status)   CURRENT MEDS:    sodium chloride   Intravenous Once   acetaminophen  650 mg Per Tube Q6H   Or   acetaminophen  650 mg Rectal Q6H   aspirin  81 mg Per Tube Q0600   chlorhexidine gluconate (MEDLINE KIT)  15 mL Mouth Rinse BID   Chlorhexidine Gluconate Cloth  6 each Topical Q0600   feeding supplement (PROSource TF)  90 mL Per Tube TID   furosemide  80 mg Intravenous BID   gabapentin  300 mg Per Tube BID   insulin aspart  0-15 Units Subcutaneous Q4H   mouth rinse  15 mL Mouth Rinse 10 times per day   pantoprazole sodium  40 mg Per Tube Daily   polyethylene glycol  17 g Per Tube Daily   sennosides  5 mL Per Tube BID   sodium chloride flush  3 mL Intravenous Q12H   sodium chloride HYPERTONIC  4 mL Nebulization Q4H      Office: [phone] 09/08/2021  

## 2021-09-07 NOTE — TOC Initial Note (Signed)
Transition of Care [facility]) - Initial/Assessment Note    Patient Details  Name: Frederick Castaneda MRN: [id number] Date of Birth: 30-Sep-1961  [facility]) CM/SW Contact:    Elliot Cousin, RN Phone Number: [phone] 09/06/2021, 10:57 AM  Clinical Narrative:                  HF TOC CM spoke to pt's brother, [name] and niece, Duwayne Heck. States [employer] will be faxing disability paperwork from his job. Pt lives at home alone. Explained to brother will wait for PT/OT recommendations for dc. HF TOC CM/CSW will continue to follow for dc needs.      Expected Discharge Plan: Skilled Nursing Facility Barriers to Discharge: Continued Medical Work up   Patient Goals and CMS Choice Patient states their goals for this hospitalization and ongoing recovery are:: brother states he will need rehab CMS Medicare.gov Compare Post Acute Care list provided to:: Patient Represenative (must comment) (brother-Dale Stringfellow) Choice offered to / list presented to : Sibling  Expected Discharge Plan and Services Expected Discharge Plan: Skilled Nursing Facility In-house Referral: Clinical Social Work Discharge Planning Services: CM Consult Post Acute Care Choice: Skilled Nursing Facility Living arrangements for the past 2 months: Apartment                                      Prior Living Arrangements/Services Living arrangements for the past 2 months: Apartment Lives with:: Self Patient language and need for interpreter reviewed:: Yes Do you feel safe going back to the place where you live?: No   will need assistance post dc  Need for Family Participation in Patient Care: Yes (Comment) Care giver support system in place?: Yes (comment)   Criminal Activity/Legal Involvement Pertinent to Current Situation/Hospitalization: No - Comment as needed  Activities of Daily Living      Permission Sought/Granted Permission sought to share information with : Case Manager, Family  Supports, PCP Permission granted to share information with : Yes, Verbal Permission Granted  Share Information with NAME: Kenrick Pore  Permission granted to share info w AGENCY: SNF  Permission granted to share info w Relationship: brother  Permission granted to share info w Contact Information: [phone]  Emotional Assessment Appearance:: Appears stated age Attitude/Demeanor/Rapport: Intubated (Following Commands or Not Following Commands)   Orientation: : Oriented to Self, Oriented to Place, Oriented to  Time, Oriented to Situation   Psych Involvement: No (comment)  Admission diagnosis:  Trauma [T14.90XA] Cardiopulmonary resuscitation (CPR)-only resuscitation status [Z78.9] AMS (altered mental status) [R41.82] Patient Active Problem List   Diagnosis Date Noted   Cardiac arrest [facility])    AMS (altered mental status) 10/01/2021   PCP:  Pcp, No Pharmacy:  No Pharmacies Listed    Social Determinants of Health (SDOH) Interventions    Readmission Risk Interventions No flowsheet data found.
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
## 2021-09-07 NOTE — Progress Notes (Signed)
RT transported patient from cath lab room 5 to 2H01 with RN. No complications.
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
## 2021-09-08 ENCOUNTER — Inpatient Hospital Stay (HOSPITAL_COMMUNITY): Payer: 59

## 2021-09-08 LAB — POCT I-STAT 7, (LYTES, BLD GAS, ICA,H+H)
Acid-Base Excess: 1 mmol/L (ref 0.0–2.0)
Bicarbonate: 25.9 mmol/L (ref 20.0–28.0)
Calcium, Ion: 1.19 mmol/L (ref 1.15–1.40)
HCT: 35 % — ABNORMAL LOW (ref 39.0–52.0)
Hemoglobin: 11.9 g/dL — ABNORMAL LOW (ref 13.0–17.0)
O2 Saturation: 94 %
Patient temperature: 38.9
Potassium: 3.7 mmol/L (ref 3.5–5.1)
Sodium: 143 mmol/L (ref 135–145)
TCO2: 27 mmol/L (ref 22–32)
pCO2 arterial: 44 mmHg (ref 32.0–48.0)
pH, Arterial: 7.385 (ref 7.350–7.450)
pO2, Arterial: 81 mmHg — ABNORMAL LOW (ref 83.0–108.0)

## 2021-09-08 LAB — BRAIN NATRIURETIC PEPTIDE: B Natriuretic Peptide: 1069.3 pg/mL — ABNORMAL HIGH (ref 0.0–100.0)

## 2021-09-08 LAB — BASIC METABOLIC PANEL
Anion gap: 7 (ref 5–15)
BUN: 23 mg/dL (ref 8–23)
CO2: 24 mmol/L (ref 22–32)
Calcium: 8.3 mg/dL — ABNORMAL LOW (ref 8.9–10.3)
Chloride: 106 mmol/L (ref 98–111)
Creatinine, Ser: 1.06 mg/dL (ref 0.61–1.24)
GFR, Estimated: >60 mL/min (ref >60)
Glucose, Bld: 157 mg/dL — ABNORMAL HIGH (ref 70–99)
Potassium: 3.7 mmol/L (ref 3.5–5.1)
Sodium: 137 mmol/L (ref 135–145)

## 2021-09-08 LAB — CBC
HCT: 34.9 % — ABNORMAL LOW (ref 39.0–52.0)
Hemoglobin: 11.2 g/dL — ABNORMAL LOW (ref 13.0–17.0)
MCH: 32.7 pg (ref 26.0–34.0)
MCHC: 32.1 g/dL (ref 30.0–36.0)
MCV: 101.7 fL — ABNORMAL HIGH (ref 80.0–100.0)
Platelets: 183 10*3/uL (ref 150–400)
RBC: 3.43 MIL/uL — ABNORMAL LOW (ref 4.22–5.81)
RDW: 14.5 % (ref 11.5–15.5)
WBC: 11.2 10*3/uL — ABNORMAL HIGH (ref 4.0–10.5)
nRBC: 0.2 % (ref 0.0–0.2)

## 2021-09-08 LAB — GLUCOSE, CAPILLARY
Glucose-Capillary: 109 mg/dL — ABNORMAL HIGH (ref 70–99)
Glucose-Capillary: 115 mg/dL — ABNORMAL HIGH (ref 70–99)
Glucose-Capillary: 116 mg/dL — ABNORMAL HIGH (ref 70–99)
Glucose-Capillary: 121 mg/dL — ABNORMAL HIGH (ref 70–99)
Glucose-Capillary: 126 mg/dL — ABNORMAL HIGH (ref 70–99)
Glucose-Capillary: 137 mg/dL — ABNORMAL HIGH (ref 70–99)

## 2021-09-08 LAB — COOXEMETRY PANEL
Carboxyhemoglobin: 1.5 % (ref 0.5–1.5)
Methemoglobin: 0.7 % (ref 0.0–1.5)
O2 Saturation: 70.5 %
Total hemoglobin: 10.8 g/dL — ABNORMAL LOW (ref 12.0–16.0)

## 2021-09-08 LAB — PROCALCITONIN: Procalcitonin: 6.01 ng/mL

## 2021-09-08 LAB — HEPARIN LEVEL (UNFRACTIONATED): Heparin Unfractionated: 0.31 IU/mL (ref 0.30–0.70)

## 2021-09-08 MED ORDER — VITAL 1.5 CAL PO LIQD
1000.0000 mL | ORAL | Status: DC
  Administered 2021-09-08 – 2021-09-13 (×3): 1000 mL

## 2021-09-08 MED ORDER — POTASSIUM CHLORIDE 20 MEQ PO PACK
40.0000 meq | PACK | Freq: Once | ORAL | Status: AC
  Administered 2021-09-08: 40 meq
  Filled 2021-09-08: qty 2

## 2021-09-08 MED ORDER — SODIUM CHLORIDE 0.9 % IV SOLN
2.0000 g | Freq: Three times a day (TID) | INTRAVENOUS | Status: DC
  Administered 2021-09-08 – 2021-09-13 (×16): 2 g via INTRAVENOUS
  Filled 2021-09-08 (×18): qty 2

## 2021-09-08 MED ORDER — PROPOFOL 1000 MG/100ML IV EMUL
5.0000 ug/kg/min | INTRAVENOUS | Status: DC
  Administered 2021-09-08: 5 ug/kg/min via INTRAVENOUS
  Administered 2021-09-08: 15 ug/kg/min via INTRAVENOUS
  Administered 2021-09-09: 38 ug/kg/min via INTRAVENOUS
  Administered 2021-09-09: 20 ug/kg/min via INTRAVENOUS
  Administered 2021-09-09: 10 ug/kg/min via INTRAVENOUS
  Administered 2021-09-09: 40 ug/kg/min via INTRAVENOUS
  Administered 2021-09-10: 60 ug/kg/min via INTRAVENOUS
  Administered 2021-09-10: 35 ug/kg/min via INTRAVENOUS
  Administered 2021-09-10 (×3): 60 ug/kg/min via INTRAVENOUS
  Administered 2021-09-10: 50 ug/kg/min via INTRAVENOUS
  Administered 2021-09-10: 35 ug/kg/min via INTRAVENOUS
  Administered 2021-09-11 (×3): 60 ug/kg/min via INTRAVENOUS
  Administered 2021-09-11 (×2): 50 ug/kg/min via INTRAVENOUS
  Administered 2021-09-12: 30 ug/kg/min via INTRAVENOUS
  Administered 2021-09-12: 20 ug/kg/min via INTRAVENOUS
  Administered 2021-09-12: 30 ug/kg/min via INTRAVENOUS
  Administered 2021-09-12 (×2): 50 ug/kg/min via INTRAVENOUS
  Administered 2021-09-13: 30 ug/kg/min via INTRAVENOUS
  Administered 2021-09-13: 10 ug/kg/min via INTRAVENOUS
  Administered 2021-09-13 – 2021-09-14 (×4): 20 ug/kg/min via INTRAVENOUS
  Filled 2021-09-08: qty 100
  Filled 2021-09-08: qty 200
  Filled 2021-09-08 (×5): qty 100
  Filled 2021-09-08 (×2): qty 200
  Filled 2021-09-08 (×19): qty 100

## 2021-09-08 MED ORDER — HYDRALAZINE HCL 20 MG/ML IJ SOLN
5.0000 mg | INTRAMUSCULAR | Status: DC | PRN
  Administered 2021-09-11 – 2021-09-12 (×2): 5 mg via INTRAVENOUS
  Filled 2021-09-08 (×2): qty 1

## 2021-09-08 MED ORDER — METOLAZONE 2.5 MG PO TABS
2.5000 mg | ORAL_TABLET | Freq: Once | ORAL | Status: AC
  Administered 2021-09-08: 2.5 mg
  Filled 2021-09-08: qty 1

## 2021-09-08 MED ORDER — VANCOMYCIN HCL 1750 MG/350ML IV SOLN
1750.0000 mg | INTRAVENOUS | Status: DC
  Administered 2021-09-08 – 2021-09-09 (×2): 1750 mg via INTRAVENOUS
  Filled 2021-09-08 (×3): qty 350

## 2021-09-08 NOTE — Progress Notes (Addendum)
eLink Physician-Brief Progress Note Patient Name: Frederick Castaneda DOB: 12/15/1960 MRN: [id number]   Date of Service  09/08/2021  HPI/Events of Note  Nursing reports that patient is agitated and biting on ETT. Patient currently on maximum doses of Precedex and Fentanyl IV infusions. Versed IV pushes not felt to be effective. Video assessment reveals patient to be well sedated.   eICU Interventions  Plan: Increase ceiling on Fentanyl IV infusion to 300 mcg/hour.     Intervention Category Major Interventions: Delirium, psychosis, severe agitation - evaluation and management  Justene Jensen Eugene 09/08/2021, 3:10 AM
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
## 2021-09-08 NOTE — Progress Notes (Signed)
[facility] ADULT ICU REPLACEMENT PROTOCOL   The patient does apply for the [facility] Adult ICU Electrolyte Replacment Protocol based on the criteria listed below:   1.Exclusion criteria: TCTS patients, ECMO patients, and Dialysis patients 2. Is GFR >/= 30 ml/min? Yes.    Patient's GFR today is >60 3. Is SCr </= 2? Yes.   Patient's SCr is 1.06 mg/dL 4. Did SCr increase >/= 0.5 in 24 hours? No. 5.Pt's weight >40kg  Yes.   6. Abnormal electrolyte(s): K+ 3.7  7. Electrolytes replaced per protocol 8.  Call MD STAT for K+ </= 2.5, Phos </= 1, or Mag </= 1 Physician:  n/a   Frederick Castaneda 09/08/2021 3:50 AM

## 2021-09-08 NOTE — Progress Notes (Signed)
   VASCULAR SURGERY ASSESSMENT & PLAN:   POD 2 RIGHT AKA: I changed his dressing this morning and his AKA looks good.  This is not a source of fever.  ANTICOAGULATION: He is on IV heparin.   SUBJECTIVE:   Arousable but does not follow commands.  PHYSICAL EXAM:   Vitals:   09/08/21 0600 09/08/21 0700 09/08/21 0738 09/08/21 0739  BP: 118/66     Pulse: 84 (!) 109    Resp: (!) 21 (!) 24    Temp: (!) 102.8 F (39.3 C) (!) 102.6 F (39.2 C)    TempSrc:      SpO2: 94% 96% 99% 92%  Weight:      Height:       His right AKA is healing well with no drainage. His right groin incision looks good.    LABS:   Lab Results  Component Value Date   WBC 11.2 (H) 09/08/2021   HGB 11.9 (L) 09/08/2021   HCT 35.0 (L) 09/08/2021   MCV 101.7 (H) 09/08/2021   PLT 183 09/08/2021    CBG (last 3)  Recent Labs    09/08/21 0007 09/08/21 0343 09/08/21 0736  GLUCAP 116* 126* 115*    PROBLEM LIST:    Principal Problem:   AMS (altered mental status) Active Problems:   Cardiac arrest (HCC)   CURRENT MEDS:    sodium chloride   Intravenous Once   acetaminophen  650 mg Per Tube Q6H   Or   acetaminophen  650 mg Rectal Q6H   aspirin  81 mg Per Tube Q0600   atorvastatin  40 mg Per Tube Daily   chlorhexidine gluconate (MEDLINE KIT)  15 mL Mouth Rinse BID   Chlorhexidine Gluconate Cloth  6 each Topical Q0600   feeding supplement (PROSource TF)  90 mL Per Tube TID   furosemide  80 mg Intravenous Q8H   gabapentin  300 mg Per Tube BID   insulin aspart  0-15 Units Subcutaneous Q4H   mouth rinse  15 mL Mouth Rinse 10 times per day   metolazone  2.5 mg Per Tube Once   pantoprazole sodium  40 mg Per Tube Daily   polyethylene glycol  17 g Per Tube Daily   potassium chloride  40 mEq Per Tube BID   potassium chloride  40 mEq Per Tube Once   sennosides  5 mL Per Tube BID   sodium chloride flush  3 mL Intravenous Q12H   sodium chloride flush  3 mL Intravenous Q12H   sodium chloride  HYPERTONIC  4 mL Nebulization Q4H      Office: [phone] 09/08/2021  
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
## 2021-09-08 NOTE — Progress Notes (Signed)
2 brothers updated at bedside.  Steffanie Dunn, DO 09/08/21 8:49 AM [facility]
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
## 2021-09-09 ENCOUNTER — Inpatient Hospital Stay (HOSPITAL_COMMUNITY): Payer: 59

## 2021-09-09 DIAGNOSIS — Z95828 Presence of other vascular implants and grafts: Secondary | ICD-10-CM

## 2021-09-09 DIAGNOSIS — I469 Cardiac arrest, cause unspecified: Secondary | ICD-10-CM

## 2021-09-09 DIAGNOSIS — G9341 Metabolic encephalopathy: Secondary | ICD-10-CM

## 2021-09-09 LAB — POCT I-STAT EG7
Acid-base deficit: 8 mmol/L — ABNORMAL HIGH (ref 0.0–2.0)
Bicarbonate: 18.4 mmol/L — ABNORMAL LOW (ref 20.0–28.0)
Calcium, Ion: 0.53 mmol/L — CL (ref 1.15–1.40)
HCT: 25 % — ABNORMAL LOW (ref 39.0–52.0)
Hemoglobin: 8.5 g/dL — ABNORMAL LOW (ref 13.0–17.0)
O2 Saturation: 66 %
Potassium: <2 mmol/L — CL (ref 3.5–5.1)
Sodium: 150 mmol/L — ABNORMAL HIGH (ref 135–145)
TCO2: 20 mmol/L — ABNORMAL LOW (ref 22–32)
pCO2, Ven: 38.6 mmHg — ABNORMAL LOW (ref 44.0–60.0)
pH, Ven: 7.287 (ref 7.250–7.430)
pO2, Ven: 38 mmHg (ref 32.0–45.0)

## 2021-09-09 LAB — ECHOCARDIOGRAM LIMITED
Area-P 1/2: 7.82 cm2
Calc EF: 24 %
Height: 72 in
S' Lateral: 4.4 cm
Single Plane A2C EF: 21.6 %
Single Plane A4C EF: 23.4 %
Weight: 3548.52 oz

## 2021-09-09 LAB — BASIC METABOLIC PANEL
Anion gap: 12 (ref 5–15)
Anion gap: 11 (ref 5–15)
BUN: 31 mg/dL — ABNORMAL HIGH (ref 8–23)
BUN: 31 mg/dL — ABNORMAL HIGH (ref 8–23)
CO2: 27 mmol/L (ref 22–32)
CO2: 27 mmol/L (ref 22–32)
Calcium: 8.7 mg/dL — ABNORMAL LOW (ref 8.9–10.3)
Calcium: 8.6 mg/dL — ABNORMAL LOW (ref 8.9–10.3)
Chloride: 103 mmol/L (ref 98–111)
Chloride: 102 mmol/L (ref 98–111)
Creatinine, Ser: 1.19 mg/dL (ref 0.61–1.24)
Creatinine, Ser: 1.19 mg/dL (ref 0.61–1.24)
GFR, Estimated: >60 mL/min (ref >60)
GFR, Estimated: >60 mL/min (ref >60)
Glucose, Bld: 152 mg/dL — ABNORMAL HIGH (ref 70–99)
Glucose, Bld: 139 mg/dL — ABNORMAL HIGH (ref 70–99)
Potassium: 3 mmol/L — ABNORMAL LOW (ref 3.5–5.1)
Potassium: 3.7 mmol/L (ref 3.5–5.1)
Sodium: 142 mmol/L (ref 135–145)
Sodium: 140 mmol/L (ref 135–145)

## 2021-09-09 LAB — GLUCOSE, CAPILLARY
Glucose-Capillary: 108 mg/dL — ABNORMAL HIGH (ref 70–99)
Glucose-Capillary: 110 mg/dL — ABNORMAL HIGH (ref 70–99)
Glucose-Capillary: 115 mg/dL — ABNORMAL HIGH (ref 70–99)
Glucose-Capillary: 138 mg/dL — ABNORMAL HIGH (ref 70–99)
Glucose-Capillary: 138 mg/dL — ABNORMAL HIGH (ref 70–99)
Glucose-Capillary: 138 mg/dL — ABNORMAL HIGH (ref 70–99)
Glucose-Capillary: 146 mg/dL — ABNORMAL HIGH (ref 70–99)
Glucose-Capillary: 189 mg/dL — ABNORMAL HIGH (ref 70–99)

## 2021-09-09 LAB — CBC
HCT: 37.4 % — ABNORMAL LOW (ref 39.0–52.0)
Hemoglobin: 11.9 g/dL — ABNORMAL LOW (ref 13.0–17.0)
MCH: 32.6 pg (ref 26.0–34.0)
MCHC: 31.8 g/dL (ref 30.0–36.0)
MCV: 102.5 fL — ABNORMAL HIGH (ref 80.0–100.0)
Platelets: 256 10*3/uL (ref 150–400)
RBC: 3.65 MIL/uL — ABNORMAL LOW (ref 4.22–5.81)
RDW: 14.7 % (ref 11.5–15.5)
WBC: 17.3 10*3/uL — ABNORMAL HIGH (ref 4.0–10.5)
nRBC: 0.2 % (ref 0.0–0.2)

## 2021-09-09 LAB — COOXEMETRY PANEL
Carboxyhemoglobin: 1.4 % (ref 0.5–1.5)
Methemoglobin: 0.8 % (ref 0.0–1.5)
O2 Saturation: 57 %
Total hemoglobin: 11.1 g/dL — ABNORMAL LOW (ref 12.0–16.0)

## 2021-09-09 LAB — MAGNESIUM: Magnesium: 2.2 mg/dL (ref 1.7–2.4)

## 2021-09-09 LAB — BRAIN NATRIURETIC PEPTIDE: B Natriuretic Peptide: 912.2 pg/mL — ABNORMAL HIGH (ref 0.0–100.0)

## 2021-09-09 LAB — TRIGLYCERIDES: Triglycerides: 111 mg/dL (ref <150)

## 2021-09-09 LAB — HEPARIN LEVEL (UNFRACTIONATED): Heparin Unfractionated: 0.38 IU/mL (ref 0.30–0.70)

## 2021-09-09 LAB — PROCALCITONIN: Procalcitonin: 5.49 ng/mL

## 2021-09-09 MED ORDER — POTASSIUM CHLORIDE 10 MEQ/50ML IV SOLN
10.0000 meq | Freq: Once | INTRAVENOUS | Status: AC
  Administered 2021-09-09: 10 meq via INTRAVENOUS
  Filled 2021-09-09: qty 50

## 2021-09-09 MED ORDER — PERFLUTREN LIPID MICROSPHERE
1.0000 mL | INTRAVENOUS | Status: AC | PRN
Start: 2021-09-09 — End: 2021-09-09
  Administered 2021-09-09: 2 mL via INTRAVENOUS
  Filled 2021-09-09: qty 10

## 2021-09-09 MED ORDER — POTASSIUM CHLORIDE 20 MEQ PO PACK
40.0000 meq | PACK | Freq: Three times a day (TID) | ORAL | Status: AC
  Administered 2021-09-09 (×3): 40 meq
  Filled 2021-09-09 (×3): qty 2

## 2021-09-09 MED ORDER — NOREPINEPHRINE 4 MG/250ML-% IV SOLN
0.0000 ug/min | INTRAVENOUS | Status: DC
  Administered 2021-09-09: 2 ug/min via INTRAVENOUS
  Administered 2021-09-10: 6 ug/min via INTRAVENOUS
  Administered 2021-09-11: 4 ug/min via INTRAVENOUS
  Filled 2021-09-09 (×3): qty 250

## 2021-09-09 MED ORDER — POTASSIUM CHLORIDE 20 MEQ PO PACK
20.0000 meq | PACK | ORAL | Status: DC
  Administered 2021-09-09: 20 meq
  Filled 2021-09-09: qty 1

## 2021-09-09 MED ORDER — POTASSIUM CHLORIDE 10 MEQ/50ML IV SOLN
10.0000 meq | INTRAVENOUS | Status: DC
  Administered 2021-09-09: 10 meq via INTRAVENOUS
  Filled 2021-09-09: qty 50

## 2021-09-09 MED ORDER — QUETIAPINE FUMARATE 25 MG PO TABS
25.0000 mg | ORAL_TABLET | Freq: Two times a day (BID) | ORAL | Status: DC
Start: 2021-09-09 — End: 2021-09-10
  Administered 2021-09-09 (×2): 25 mg
  Filled 2021-09-09 (×2): qty 1

## 2021-09-09 MED ORDER — FENTANYL BOLUS VIA INFUSION
50.0000 ug | INTRAVENOUS | Status: DC | PRN
  Administered 2021-09-09 – 2021-09-12 (×2): 50 ug via INTRAVENOUS
  Administered 2021-09-14: 06:00:00 75 ug via INTRAVENOUS
  Filled 2021-09-09: qty 100
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
## 2021-09-09 NOTE — Procedures (Signed)
Arterial Catheter Insertion Procedure Note  ANDREN BETHEA  [id number]  1961/02/05  Date:09/09/21  Time:3:42 PM    Provider Performing: Lidia Collum    Procedure: Insertion of Arterial Line (50093) with US guidance (81829)   Indication(s) Blood pressure monitoring and/or need for frequent ABGs  Consent Unable to obtain consent due to emergent nature of procedure.  Anesthesia None   Time Out Verified patient identification, verified procedure, site/side was marked, verified correct patient position, special equipment/implants available, medications/allergies/relevant history reviewed, required imaging and test results available.   Sterile Technique Maximal sterile technique including full sterile barrier drape, hand hygiene, sterile gown, sterile gloves, mask, hair covering, sterile ultrasound probe cover (if used).   Procedure Description Area of catheter insertion was cleaned with chlorhexidine and draped in sterile fashion. With real-time ultrasound guidance an arterial catheter was placed into the right radial artery.  Appropriate arterial tracings confirmed on monitor.     Complications/Tolerance None; patient tolerated the procedure well.   EBL Minimal   Specimen(s) None  JD Anselm Lis [facility] 09/09/2021, 3:43 PM  Please see [email or web address] for pager details.  From 7A-7P if no response, please call [phone]. After hours, please call ELink [phone].

## 2021-09-09 NOTE — Progress Notes (Signed)
0700 - Report received from PM RN.  All questions answered.  Safety checks performed. All lines verified. Hand hygiene performed before/after each pt contact. 0730 Frederick Castaneda, HFPA rounded on pt. 0800 - Dr. Chestine Spore rounded on pt.  0820 - Pt placed on CPAP mode on vent. 0830 - [name] arrived to visit.  Dr. Chestine Spore updated pt's brother. 0930 - ECHO performed. 0950 - Pt's SpO2 dropped into the 80s and he was pulling insufficient volumes.  Pt placed back on a rate.  Settings: PRVC 60%, PEEP 5, RR 18, Vt 620. 1045 - Pt w/non-pulsatile eight beat run of VT.  Pt recovered.  Dr. Chestine Spore and Avon Gully, NP notified. 1430 - Heparin paused for new a-line placement. 1530 - New a-line placed d/t previous a-line being positional and pt's brief pulseless VT episode. 1630 - Heparin restarted. 1800 - Pt noted to be incontinent of feces.  Pt cleaned and turned. 1900 - Report given to PM RN.  All questions answered.

## 2021-09-09 NOTE — Progress Notes (Signed)
[facility] ADULT ICU REPLACEMENT PROTOCOL   The patient does apply for the [facility] Adult ICU Electrolyte Replacment Protocol based on the criteria listed below:   1.Exclusion criteria: TCTS patients, ECMO patients, and Dialysis patients 2. Is GFR >/= 30 ml/min? Yes.    Patient's GFR today is >60 3. Is SCr </= 2? Yes.   Patient's SCr is 1.19 mg/dL 4. Did SCr increase >/= 0.5 in 24 hours? No. 5.Pt's weight >40kg  Yes.   6. Abnormal electrolyte(s): K+ 3.0  7. Electrolytes replaced per protocol 8.  Call MD STAT for K+ </= 2.5, Phos </= 1, or Mag </= 1 Physician:  n/a  Melvern Banker 09/09/2021 6:48 AM

## 2021-09-09 NOTE — Progress Notes (Signed)
E-Link notified to reorder Levo to help maintain MAP > 65.  MAP has been ~60 after PRN Versed pushes for RASS goal.  Will attempt to adjust sedation/pain gtts to turn off Levo.
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
## 2021-09-10 ENCOUNTER — Inpatient Hospital Stay (HOSPITAL_COMMUNITY): Payer: 59

## 2021-09-10 DIAGNOSIS — Z789 Other specified health status: Secondary | ICD-10-CM

## 2021-09-10 DIAGNOSIS — K567 Ileus, unspecified: Secondary | ICD-10-CM

## 2021-09-10 DIAGNOSIS — J810 Acute pulmonary edema: Secondary | ICD-10-CM

## 2021-09-10 DIAGNOSIS — Y95 Nosocomial condition: Secondary | ICD-10-CM

## 2021-09-10 DIAGNOSIS — J189 Pneumonia, unspecified organism: Secondary | ICD-10-CM

## 2021-09-10 LAB — CBC
HCT: 39 % (ref 39.0–52.0)
Hemoglobin: 12.4 g/dL — ABNORMAL LOW (ref 13.0–17.0)
MCH: 32.6 pg (ref 26.0–34.0)
MCHC: 31.8 g/dL (ref 30.0–36.0)
MCV: 102.6 fL — ABNORMAL HIGH (ref 80.0–100.0)
Platelets: 329 10*3/uL (ref 150–400)
RBC: 3.8 MIL/uL — ABNORMAL LOW (ref 4.22–5.81)
RDW: 14.8 % (ref 11.5–15.5)
WBC: 19.7 10*3/uL — ABNORMAL HIGH (ref 4.0–10.5)
nRBC: 0.1 % (ref 0.0–0.2)

## 2021-09-10 LAB — CULTURE, RESPIRATORY W GRAM STAIN: Culture: NORMAL

## 2021-09-10 LAB — BASIC METABOLIC PANEL
Anion gap: 13 (ref 5–15)
Anion gap: 14 (ref 5–15)
BUN: 39 mg/dL — ABNORMAL HIGH (ref 8–23)
BUN: 44 mg/dL — ABNORMAL HIGH (ref 8–23)
CO2: 29 mmol/L (ref 22–32)
CO2: 27 mmol/L (ref 22–32)
Calcium: 8.6 mg/dL — ABNORMAL LOW (ref 8.9–10.3)
Calcium: 8.5 mg/dL — ABNORMAL LOW (ref 8.9–10.3)
Chloride: 100 mmol/L (ref 98–111)
Chloride: 101 mmol/L (ref 98–111)
Creatinine, Ser: 1.18 mg/dL (ref 0.61–1.24)
Creatinine, Ser: 1.41 mg/dL — ABNORMAL HIGH (ref 0.61–1.24)
GFR, Estimated: >60 mL/min (ref >60)
GFR, Estimated: 57 mL/min — ABNORMAL LOW (ref >60)
Glucose, Bld: 133 mg/dL — ABNORMAL HIGH (ref 70–99)
Glucose, Bld: 121 mg/dL — ABNORMAL HIGH (ref 70–99)
Potassium: 3.7 mmol/L (ref 3.5–5.1)
Potassium: 3.7 mmol/L (ref 3.5–5.1)
Sodium: 142 mmol/L (ref 135–145)
Sodium: 142 mmol/L (ref 135–145)

## 2021-09-10 LAB — GLUCOSE, CAPILLARY
Glucose-Capillary: 123 mg/dL — ABNORMAL HIGH (ref 70–99)
Glucose-Capillary: 81 mg/dL (ref 70–99)
Glucose-Capillary: 88 mg/dL (ref 70–99)
Glucose-Capillary: 87 mg/dL (ref 70–99)
Glucose-Capillary: 79 mg/dL (ref 70–99)
Glucose-Capillary: 128 mg/dL — ABNORMAL HIGH (ref 70–99)

## 2021-09-10 LAB — COOXEMETRY PANEL
Carboxyhemoglobin: 1.6 % — ABNORMAL HIGH (ref 0.5–1.5)
Methemoglobin: 0.8 % (ref 0.0–1.5)
O2 Saturation: 63.1 %
Total hemoglobin: 12.7 g/dL (ref 12.0–16.0)

## 2021-09-10 LAB — PROCALCITONIN: Procalcitonin: 8.46 ng/mL

## 2021-09-10 LAB — HEPARIN LEVEL (UNFRACTIONATED): Heparin Unfractionated: 0.45 IU/mL (ref 0.30–0.70)

## 2021-09-10 MED ORDER — QUETIAPINE FUMARATE 50 MG PO TABS
50.0000 mg | ORAL_TABLET | Freq: Two times a day (BID) | ORAL | Status: DC
Start: 2021-09-10 — End: 2021-09-14
  Administered 2021-09-10 – 2021-09-13 (×8): 50 mg
  Filled 2021-09-10 (×8): qty 1

## 2021-09-10 MED ORDER — POTASSIUM CHLORIDE 10 MEQ/50ML IV SOLN
10.0000 meq | INTRAVENOUS | Status: AC
  Administered 2021-09-10 (×4): 10 meq via INTRAVENOUS
  Filled 2021-09-10 (×4): qty 50

## 2021-09-10 MED ORDER — METOCLOPRAMIDE HCL 5 MG/ML IJ SOLN
10.0000 mg | Freq: Once | INTRAMUSCULAR | Status: AC
  Administered 2021-09-10: 10 mg via INTRAVENOUS
  Filled 2021-09-10: qty 2

## 2021-09-10 MED ORDER — POTASSIUM CHLORIDE 10 MEQ/50ML IV SOLN
10.0000 meq | INTRAVENOUS | Status: AC
  Administered 2021-09-10 (×2): 10 meq via INTRAVENOUS
  Filled 2021-09-10: qty 50

## 2021-09-10 MED ORDER — POTASSIUM CHLORIDE 20 MEQ PO PACK: 40.0000 meq | PACK | Freq: Two times a day (BID) | ORAL | Status: DC

## 2021-09-10 MED ORDER — DEXTROSE 5 % IV SOLN
500.0000 mg | Freq: Once | INTRAVENOUS | Status: AC
  Administered 2021-09-10: 500 mg via INTRAVENOUS
  Filled 2021-09-10: qty 500

## 2021-09-10 MED ORDER — BISACODYL 10 MG RE SUPP
10.0000 mg | Freq: Every day | RECTAL | Status: DC | PRN
  Administered 2021-09-10: 10 mg via RECTAL
  Filled 2021-09-10: qty 1

## 2021-09-10 NOTE — Progress Notes (Signed)
    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 nicely without signs of infection  Wells Sammi Stolarz 09/10/2021 9:27 AM --  Vitals:   09/10/21 0830 09/10/21 0850  BP:    Pulse: 90   Resp: 18   Temp:  99.7 F (37.6 C)  SpO2: 94%     Intake/Output Summary (Last 24 hours) at 09/10/2021 0927 Last data filed at 09/10/2021 0800 Gross per 24 hour  Intake 4458.5 ml  Output 4265 ml  Net 193.5 ml     Laboratory CBC    Component Value Date/Time   WBC 19.7 (H) 09/10/2021 0349   HGB 12.4 (L) 09/10/2021 0349   HCT 39.0 09/10/2021 0349   PLT 329 09/10/2021 0349    BMET    Component Value Date/Time   NA 142 09/10/2021 0349   K 3.7 09/10/2021 0349   CL 100 09/10/2021 0349   CO2 29 09/10/2021 0349   GLUCOSE 133 (H) 09/10/2021 0349   BUN 39 (H) 09/10/2021 0349   CREATININE 1.18 09/10/2021 0349   CALCIUM 8.6 (L) 09/10/2021 0349   GFRNONAA >60 09/10/2021 0349    COAG Lab Results  Component Value Date   INR 1.1 Sep 25, 2021   INR 1.1 09/25/2021   No results found for: PTT  Antibiotics Anti-infectives (From admission, onward)    Start     Dose/Rate Route Frequency Ordered Stop   09/08/21 1000  vancomycin (VANCOREADY) IVPB 1750 mg/350 mL        1,750 mg 175 mL/hr over 120 Minutes Intravenous Every 24 hours 09/08/21 0759     09/08/21 0930  cefTAZidime (FORTAZ) 2 g in sodium chloride 0.9 % 100 mL IVPB        2 g 200 mL/hr over 30 Minutes Intravenous Every 8 hours 09/08/21 0755     09/11/2021 1115  Ampicillin-Sulbactam (UNASYN) 3 g in sodium chloride 0.9 % 100 mL IVPB        3 g 200 mL/hr over 30 Minutes Intravenous To Surgery 09/05/2021 1103 09/02/2021 1049   09-25-2021  1600  Ampicillin-Sulbactam (UNASYN) 3 g in sodium chloride 0.9 % 100 mL IVPB        3 g 200 mL/hr over 30 Minutes Intravenous Every 6 hours 09-25-2021 1525 09/05/21 2155   September 25, 2021 0915  Ampicillin-Sulbactam (UNASYN) 3 g in sodium chloride 0.9 % 100 mL IVPB        3 g 200 mL/hr over 30 Minutes Intravenous  Once September 25, 2021 0903 Sep 25, 2021 1155        V. Charlena Cross, M.D., [facility] [place] Office: [phone] Pager:  [phone]

## 2021-09-10 NOTE — Plan of Care (Signed)
  Problem: Education: Goal: Knowledge of General Education information will improve Description: Including pain rating scale, medication(s)/side effects and non-pharmacologic comfort measures Outcome: Progressing   Problem: Health Behavior/Discharge Planning: Goal: Ability to manage health-related needs will improve Outcome: Progressing   Problem: Clinical Measurements: Goal: Ability to maintain clinical measurements within normal limits will improve Outcome: Progressing Goal: Will remain free from infection Outcome: Progressing Goal: Diagnostic test results will improve Outcome: Progressing   Problem: Elimination: Goal: Will not experience complications related to bowel motility Outcome: Progressing   Problem: Safety: Goal: Non-violent Restraint(s) Outcome: Progressing
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
## 2021-09-10 NOTE — Progress Notes (Signed)
0700 - Report received from PM RN.  All questions answered.  Safety checks performed.  All lines verified. Hand hygiene performed before/after each pt contact. 0800 - Vascular rounded. 0930 - Dr. Celine Mans rounded. 1230 - Pt w/lg, loose BM.  Pt cleaned. 1300 - Pt's niece arrived to visit.  All questions answered.  Pt's niece was concerned that she did not know about the pt's aspiration overnight.  She advised that she wanted the family to be informed should any major event, such as that, occur.  We discussed the possibility that her uncle was notified; however, I also advised her that I could not say for certain either way. 1400 - Pt w/lg, loose BM.  Pt cleaned. 1430 - [name] arrived to visit.  All questions answered. 1630 - Dr. Celine Mans notified of pt's BMs and pt's niece's request for an update.   1645 - Dr. Celine Mans advised that she called the niece and updated her. 1900 - Report given to PM RN.  All questions answered.

## 2021-09-10 NOTE — Progress Notes (Signed)
 of Tube Feed residual noted and returned.

## 2021-09-10 NOTE — Progress Notes (Signed)
RN entered room and noted patient to be vomiting Tube Feed from mouth. RN hooked OG Tube to wall suction immediately to decompress the stomach. Tube Feed/Gastric Content noted in canister . Pt is now on low intermittent suction. Elink made aware.

## 2021-09-10 NOTE — Progress Notes (Addendum)
Pt is coughing up what appears to be tube feeding. RN was able to suction some of the content out of the patients mouth. RN noted little amounts of tube feeds in the patients ET Tube when in-line suctioning. Elink made aware. Tube feeds are now on hold.
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
## 2021-09-11 DIAGNOSIS — I5043 Acute on chronic combined systolic (congestive) and diastolic (congestive) heart failure: Secondary | ICD-10-CM

## 2021-09-11 LAB — BASIC METABOLIC PANEL
Anion gap: 12 (ref 5–15)
BUN: 45 mg/dL — ABNORMAL HIGH (ref 8–23)
CO2: 29 mmol/L (ref 22–32)
Calcium: 8.3 mg/dL — ABNORMAL LOW (ref 8.9–10.3)
Chloride: 98 mmol/L (ref 98–111)
Creatinine, Ser: 1.5 mg/dL — ABNORMAL HIGH (ref 0.61–1.24)
GFR, Estimated: 53 mL/min — ABNORMAL LOW (ref >60)
Glucose, Bld: 144 mg/dL — ABNORMAL HIGH (ref 70–99)
Potassium: 3.5 mmol/L (ref 3.5–5.1)
Sodium: 139 mmol/L (ref 135–145)

## 2021-09-11 LAB — COOXEMETRY PANEL
Carboxyhemoglobin: 2 % — ABNORMAL HIGH (ref 0.5–1.5)
Carboxyhemoglobin: 1.7 % — ABNORMAL HIGH (ref 0.5–1.5)
Methemoglobin: 0.9 % (ref 0.0–1.5)
Methemoglobin: 0.9 % (ref 0.0–1.5)
O2 Saturation: 81.7 %
O2 Saturation: 77.8 %
Total hemoglobin: 10.2 g/dL — ABNORMAL LOW (ref 12.0–16.0)
Total hemoglobin: 12.1 g/dL (ref 12.0–16.0)

## 2021-09-11 LAB — CBC
HCT: 34.6 % — ABNORMAL LOW (ref 39.0–52.0)
Hemoglobin: 10.9 g/dL — ABNORMAL LOW (ref 13.0–17.0)
MCH: 32.4 pg (ref 26.0–34.0)
MCHC: 31.5 g/dL (ref 30.0–36.0)
MCV: 103 fL — ABNORMAL HIGH (ref 80.0–100.0)
Platelets: 369 10*3/uL (ref 150–400)
RBC: 3.36 MIL/uL — ABNORMAL LOW (ref 4.22–5.81)
RDW: 15.2 % (ref 11.5–15.5)
WBC: 16 10*3/uL — ABNORMAL HIGH (ref 4.0–10.5)
nRBC: 0.1 % (ref 0.0–0.2)

## 2021-09-11 LAB — GLUCOSE, CAPILLARY
Glucose-Capillary: 105 mg/dL — ABNORMAL HIGH (ref 70–99)
Glucose-Capillary: 112 mg/dL — ABNORMAL HIGH (ref 70–99)
Glucose-Capillary: 116 mg/dL — ABNORMAL HIGH (ref 70–99)
Glucose-Capillary: 127 mg/dL — ABNORMAL HIGH (ref 70–99)
Glucose-Capillary: 131 mg/dL — ABNORMAL HIGH (ref 70–99)
Glucose-Capillary: 89 mg/dL (ref 70–99)

## 2021-09-11 LAB — HEPARIN LEVEL (UNFRACTIONATED): Heparin Unfractionated: 0.54 IU/mL (ref 0.30–0.70)

## 2021-09-11 MED ORDER — HYDRALAZINE HCL 10 MG PO TABS
10.0000 mg | ORAL_TABLET | Freq: Three times a day (TID) | ORAL | Status: DC
  Filled 2021-09-11: qty 1

## 2021-09-11 MED ORDER — METOLAZONE 2.5 MG PO TABS
2.5000 mg | ORAL_TABLET | Freq: Once | ORAL | Status: AC
  Administered 2021-09-11: 2.5 mg
  Filled 2021-09-11: qty 1

## 2021-09-11 MED ORDER — CARVEDILOL 6.25 MG PO TABS: 6.2500 mg | ORAL_TABLET | Freq: Two times a day (BID) | ORAL | Status: DC

## 2021-09-11 MED ORDER — FUROSEMIDE 10 MG/ML IJ SOLN
80.0000 mg | Freq: Two times a day (BID) | INTRAMUSCULAR | Status: DC
  Administered 2021-09-11 – 2021-09-12 (×2): 80 mg via INTRAVENOUS
  Filled 2021-09-11 (×2): qty 8

## 2021-09-11 MED ORDER — CARVEDILOL 6.25 MG PO TABS
6.2500 mg | ORAL_TABLET | Freq: Two times a day (BID) | ORAL | Status: DC
  Filled 2021-09-11: qty 1

## 2021-09-11 MED ORDER — ACETAMINOPHEN 650 MG RE SUPP: 650.0000 mg | Freq: Four times a day (QID) | RECTAL | Status: DC

## 2021-09-11 MED ORDER — HYDRALAZINE HCL 10 MG PO TABS
10.0000 mg | ORAL_TABLET | Freq: Three times a day (TID) | ORAL | Status: DC
Start: 2021-09-11 — End: 2021-09-13
  Administered 2021-09-11 – 2021-09-13 (×6): 10 mg
  Filled 2021-09-11 (×5): qty 1

## 2021-09-11 MED ORDER — POTASSIUM CHLORIDE 20 MEQ PO PACK
40.0000 meq | PACK | ORAL | Status: AC
  Administered 2021-09-11 (×3): 40 meq
  Filled 2021-09-11 (×3): qty 2

## 2021-09-11 MED ORDER — ACETAMINOPHEN 160 MG/5ML PO SOLN
650.0000 mg | Freq: Four times a day (QID) | ORAL | Status: DC
Start: 2021-09-11 — End: 2021-09-14
  Administered 2021-09-11 – 2021-09-14 (×13): 650 mg
  Filled 2021-09-11 (×13): qty 20.3

## 2021-09-11 MED ORDER — CLONAZEPAM 0.5 MG PO TBDP
0.5000 mg | ORAL_TABLET | Freq: Three times a day (TID) | ORAL | Status: DC
Start: 2021-09-11 — End: 2021-09-13
  Administered 2021-09-11 – 2021-09-13 (×7): 0.5 mg
  Filled 2021-09-11 (×7): qty 1
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
## 2021-09-12 LAB — CBC
HCT: 33.3 % — ABNORMAL LOW (ref 39.0–52.0)
Hemoglobin: 10.5 g/dL — ABNORMAL LOW (ref 13.0–17.0)
MCH: 32.6 pg (ref 26.0–34.0)
MCHC: 31.5 g/dL (ref 30.0–36.0)
MCV: 103.4 fL — ABNORMAL HIGH (ref 80.0–100.0)
Platelets: 410 10*3/uL — ABNORMAL HIGH (ref 150–400)
RBC: 3.22 MIL/uL — ABNORMAL LOW (ref 4.22–5.81)
RDW: 15.8 % — ABNORMAL HIGH (ref 11.5–15.5)
WBC: 20.1 10*3/uL — ABNORMAL HIGH (ref 4.0–10.5)
nRBC: 0.1 % (ref 0.0–0.2)

## 2021-09-12 LAB — BASIC METABOLIC PANEL
Anion gap: 12 (ref 5–15)
BUN: 49 mg/dL — ABNORMAL HIGH (ref 8–23)
CO2: 29 mmol/L (ref 22–32)
Calcium: 8.7 mg/dL — ABNORMAL LOW (ref 8.9–10.3)
Chloride: 101 mmol/L (ref 98–111)
Creatinine, Ser: 1.33 mg/dL — ABNORMAL HIGH (ref 0.61–1.24)
GFR, Estimated: >60 mL/min (ref >60)
Glucose, Bld: 140 mg/dL — ABNORMAL HIGH (ref 70–99)
Potassium: 3.5 mmol/L (ref 3.5–5.1)
Sodium: 142 mmol/L (ref 135–145)

## 2021-09-12 LAB — HEPARIN LEVEL (UNFRACTIONATED): Heparin Unfractionated: 0.44 IU/mL (ref 0.30–0.70)

## 2021-09-12 LAB — GLUCOSE, CAPILLARY
Glucose-Capillary: 111 mg/dL — ABNORMAL HIGH (ref 70–99)
Glucose-Capillary: 116 mg/dL — ABNORMAL HIGH (ref 70–99)
Glucose-Capillary: 129 mg/dL — ABNORMAL HIGH (ref 70–99)
Glucose-Capillary: 130 mg/dL — ABNORMAL HIGH (ref 70–99)
Glucose-Capillary: 132 mg/dL — ABNORMAL HIGH (ref 70–99)
Glucose-Capillary: 136 mg/dL — ABNORMAL HIGH (ref 70–99)

## 2021-09-12 LAB — COOXEMETRY PANEL
Carboxyhemoglobin: 1.8 % — ABNORMAL HIGH (ref 0.5–1.5)
Methemoglobin: 0.7 % (ref 0.0–1.5)
O2 Saturation: 70.9 %
Total hemoglobin: 19.9 g/dL — ABNORMAL HIGH (ref 12.0–16.0)

## 2021-09-12 LAB — MAGNESIUM: Magnesium: 2.7 mg/dL — ABNORMAL HIGH (ref 1.7–2.4)

## 2021-09-12 LAB — TRIGLYCERIDES: Triglycerides: 141 mg/dL (ref <150)

## 2021-09-12 MED ORDER — POTASSIUM CHLORIDE 20 MEQ PO PACK: 40.0000 meq | PACK | Freq: Three times a day (TID) | ORAL | Status: DC

## 2021-09-12 MED ORDER — OXYCODONE HCL 5 MG PO TABS
5.0000 mg | ORAL_TABLET | Freq: Four times a day (QID) | ORAL | Status: DC
  Administered 2021-09-12 – 2021-09-14 (×9): 5 mg
  Filled 2021-09-12 (×9): qty 1

## 2021-09-12 MED ORDER — POTASSIUM CHLORIDE 20 MEQ PO PACK
40.0000 meq | PACK | Freq: Three times a day (TID) | ORAL | Status: AC
  Administered 2021-09-12 (×2): 40 meq
  Filled 2021-09-12 (×2): qty 2

## 2021-09-12 MED ORDER — CARVEDILOL 6.25 MG PO TABS
6.2500 mg | ORAL_TABLET | Freq: Two times a day (BID) | ORAL | Status: DC
  Administered 2021-09-12 – 2021-09-13 (×3): 6.25 mg
  Filled 2021-09-12 (×3): qty 1

## 2021-09-12 MED ORDER — SPIRONOLACTONE 12.5 MG HALF TABLET
12.5000 mg | ORAL_TABLET | Freq: Every day | ORAL | Status: DC
  Administered 2021-09-12 – 2021-09-13 (×2): 12.5 mg
  Filled 2021-09-12 (×2): qty 1
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
## 2021-09-12 NOTE — Progress Notes (Addendum)
  Progress Note    09/12/2021 7:23 AM 5 Days Post-Op  Subjective:  intubated; somewhat following commands.  Nods his head.   Vitals:   09/12/21 0550 09/12/21 0600  BP: (!) 143/50 122/77  Pulse:  100  Resp:  18  Temp:    SpO2:  97%    Physical Exam: Right groin with superficial skin separation.  No drainage or evidence of infection.  Right AKA stump healing nicely with staples in tact.  Mild erythema vs ecchymosis on anterior flap.    CBC    Component Value Date/Time   WBC 20.1 (H) 09/12/2021 0426   RBC 3.22 (L) 09/12/2021 0426   HGB 10.5 (L) 09/12/2021 0426   HCT 33.3 (L) 09/12/2021 0426   PLT 410 (H) 09/12/2021 0426   MCV 103.4 (H) 09/12/2021 0426   MCH 32.6 09/12/2021 0426   MCHC 31.5 09/12/2021 0426   RDW 15.8 (H) 09/12/2021 0426    BMET    Component Value Date/Time   NA 142 09/12/2021 0426   K 3.5 09/12/2021 0426   CL 101 09/12/2021 0426   CO2 29 09/12/2021 0426   GLUCOSE 140 (H) 09/12/2021 0426   BUN 49 (H) 09/12/2021 0426   CREATININE 1.33 (H) 09/12/2021 0426   CALCIUM 8.7 (L) 09/12/2021 0426   GFRNONAA >60 09/12/2021 0426    INR    Component Value Date/Time   INR 1.1 09/19/2021 1216     Intake/Output Summary (Last 24 hours) at 09/12/2021 0723 Last data filed at 09/12/2021 0600 Gross per 24 hour  Intake 2544.44 ml  Output 3530 ml  Net -985.56 ml     Assessment/Plan:  60 y.o. male is s/p:  Right femoral and popliteal thrombectomy 9 Days Post-Op and Right AKA  5 Days Post-Op   -stump is viable. -superficial skin separation right groin.   Continue to keep dry gauze present.  No evidence of infection   Doreatha Massed, PA-C [facility] [phone] 09/12/2021 7:23 AM  I have interviewed the patient and examined the patient. I agree with the findings by the PA. His right AKA stump looks good so far.   Cari Caraway, MD
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
## 2021-09-12 NOTE — Progress Notes (Signed)
0700 - Report received from PM RN.  All questions answered.  Safety checks performed.  All lines verified. Hand hygiene performed. 62 - Dr. Denese Killings rounded on pt. Pt placed on CPAP mode on vent.  Vascular rounded on pt.  They changed pt's dsg on pt's AKA. 0745 - Brittainy, CHF NP rounded on pt. [place] called for an update.  They were requested to call back later. 0800 - Assessment and Rx.  Propofol decreased to 15 mcg/kg/min. 0845 - Pt's RR increased to 50, SpO2 decreased to 83%.  RT placed pt back on previous PRVC settings.  Propofol increased back to 30 mcg/kg/min.  Dr. Gala Romney rounded on pt.  Pt went into A-fib w/RVR at a rate of 150.  Dr. Denese Killings notified.  Pt spontaneously converted without intervention. 0900 - [name] visited and requested update from Dr. Denese Killings.  Dr. Denese Killings updated pt's brother.  All questions answered.  They discussed possibility of impending tracheostomy and need for a family discussion regarding the trach and goals of care for the patient. 1000 - A-line removed, per order. 1020 - Pt's niece called for an update and we discussed an impending GOC meeting.  She set a time for 10:00 tomorrow.  Dr. Denese Killings notified and he agreed to 10:00 on 09/13/21. 1600 - Foley D/C'd, per order. 1800 - [name] visited and conformed 10:00 time for [facility] meeting tomorrow.  All questions answered. 1900 - Report given to PM RN.  All questions answered.
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
## 2021-09-13 ENCOUNTER — Telehealth: Payer: Self-pay | Admitting: Critical Care Medicine

## 2021-09-13 LAB — CBC WITH DIFFERENTIAL/PLATELET
Abs Immature Granulocytes: 1.6 10*3/uL — ABNORMAL HIGH (ref 0.00–0.07)
Basophils Absolute: 0 10*3/uL (ref 0.0–0.1)
Basophils Relative: 0 %
Eosinophils Absolute: 0.5 10*3/uL (ref 0.0–0.5)
Eosinophils Relative: 2 %
HCT: 34.5 % — ABNORMAL LOW (ref 39.0–52.0)
Hemoglobin: 10.9 g/dL — ABNORMAL LOW (ref 13.0–17.0)
Lymphocytes Relative: 14 %
Lymphs Abs: 3.2 10*3/uL (ref 0.7–4.0)
MCH: 33 pg (ref 26.0–34.0)
MCHC: 31.6 g/dL (ref 30.0–36.0)
MCV: 104.5 fL — ABNORMAL HIGH (ref 80.0–100.0)
Metamyelocytes Relative: 4 %
Monocytes Absolute: 2 10*3/uL — ABNORMAL HIGH (ref 0.1–1.0)
Monocytes Relative: 9 %
Myelocytes: 2 %
Neutro Abs: 15.3 10*3/uL — ABNORMAL HIGH (ref 1.7–7.7)
Neutrophils Relative %: 68 %
Platelets: 495 10*3/uL — ABNORMAL HIGH (ref 150–400)
Promyelocytes Relative: 1 %
RBC: 3.3 MIL/uL — ABNORMAL LOW (ref 4.22–5.81)
RDW: 15.9 % — ABNORMAL HIGH (ref 11.5–15.5)
WBC: 22.5 10*3/uL — ABNORMAL HIGH (ref 4.0–10.5)
nRBC: 0 /100 WBC
nRBC: 0.1 % (ref 0.0–0.2)

## 2021-09-13 LAB — BASIC METABOLIC PANEL
Anion gap: 10 (ref 5–15)
BUN: 46 mg/dL — ABNORMAL HIGH (ref 8–23)
CO2: 31 mmol/L (ref 22–32)
Calcium: 9.1 mg/dL (ref 8.9–10.3)
Chloride: 102 mmol/L (ref 98–111)
Creatinine, Ser: 1.12 mg/dL (ref 0.61–1.24)
GFR, Estimated: >60 mL/min (ref >60)
Glucose, Bld: 128 mg/dL — ABNORMAL HIGH (ref 70–99)
Potassium: 3.4 mmol/L — ABNORMAL LOW (ref 3.5–5.1)
Sodium: 143 mmol/L (ref 135–145)

## 2021-09-13 LAB — CULTURE, BLOOD (ROUTINE X 2)
Culture: NO GROWTH
Culture: NO GROWTH
Special Requests: ADEQUATE
Special Requests: ADEQUATE

## 2021-09-13 LAB — COOXEMETRY PANEL
Carboxyhemoglobin: 2.1 % — ABNORMAL HIGH (ref 0.5–1.5)
Methemoglobin: 1 % (ref 0.0–1.5)
O2 Saturation: 68.3 %
Total hemoglobin: 11.1 g/dL — ABNORMAL LOW (ref 12.0–16.0)

## 2021-09-13 LAB — BRAIN NATRIURETIC PEPTIDE: B Natriuretic Peptide: 165.1 pg/mL — ABNORMAL HIGH (ref 0.0–100.0)

## 2021-09-13 LAB — GLUCOSE, CAPILLARY
Glucose-Capillary: 120 mg/dL — ABNORMAL HIGH (ref 70–99)
Glucose-Capillary: 111 mg/dL — ABNORMAL HIGH (ref 70–99)
Glucose-Capillary: 142 mg/dL — ABNORMAL HIGH (ref 70–99)

## 2021-09-13 LAB — HEPARIN LEVEL (UNFRACTIONATED): Heparin Unfractionated: 0.58 IU/mL (ref 0.30–0.70)

## 2021-09-13 MED ORDER — LOSARTAN POTASSIUM 25 MG PO TABS
25.0000 mg | ORAL_TABLET | Freq: Every day | ORAL | Status: DC
  Administered 2021-09-13: 25 mg
  Filled 2021-09-13: qty 1

## 2021-09-13 MED ORDER — ORAL CARE MOUTH RINSE: 15.0000 mL | OROMUCOSAL | Status: DC | PRN

## 2021-09-13 MED ORDER — GLYCOPYRROLATE 0.2 MG/ML IJ SOLN
0.1000 mg | Freq: Four times a day (QID) | INTRAMUSCULAR | Status: DC | PRN
  Administered 2021-09-13 – 2021-09-14 (×2): 0.1 mg via INTRAVENOUS
  Filled 2021-09-13 (×2): qty 1

## 2021-09-13 MED ORDER — SODIUM CHLORIDE 0.9% FLUSH: 10.0000 mL | INTRAVENOUS | Status: DC | PRN

## 2021-09-13 MED ORDER — SCOPOLAMINE 1 MG/3DAYS TD PT72
1.0000 | MEDICATED_PATCH | TRANSDERMAL | Status: DC
  Administered 2021-09-13: 1.5 mg via TRANSDERMAL
  Filled 2021-09-13: qty 1

## 2021-09-13 MED ORDER — MIDAZOLAM BOLUS VIA INFUSION
2.0000 mg | INTRAVENOUS | Status: DC | PRN
  Administered 2021-09-14 (×2): 2 mg via INTRAVENOUS
  Filled 2021-09-13: qty 2

## 2021-09-13 MED ORDER — SODIUM CHLORIDE 0.9% FLUSH
10.0000 mL | Freq: Two times a day (BID) | INTRAVENOUS | Status: DC
  Administered 2021-09-13: 10 mL

## 2021-09-13 MED ORDER — MIDAZOLAM-SODIUM CHLORIDE 100-0.9 MG/100ML-% IV SOLN
0.5000 mg/h | INTRAVENOUS | Status: DC
  Administered 2021-09-13: 0.5 mg/h via INTRAVENOUS
  Filled 2021-09-13: qty 100

## 2021-09-13 MED ORDER — POTASSIUM CHLORIDE 20 MEQ PO PACK
40.0000 meq | PACK | Freq: Once | ORAL | Status: AC
  Administered 2021-09-13: 40 meq
  Filled 2021-09-13: qty 2
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
## 2021-09-13 NOTE — Progress Notes (Signed)
[facility] ADULT ICU REPLACEMENT PROTOCOL   The patient does apply for the [facility] Adult ICU Electrolyte Replacment Protocol based on the criteria listed below:   1.Exclusion criteria: TCTS patients, ECMO patients, and Dialysis patients 2. Is GFR >/= 30 ml/min? Yes.    Patient's GFR today is >60 3. Is SCr </= 2? No. Patient's SCr is 1.12 mg/dL 4. Did SCr increase >/= 0.5 in 24 hours? No. 5.Pt's weight >40kg  Yes.   6. Abnormal electrolyte(s): K 3.4  7. Electrolytes replaced per protocol   Ardelle Park 09/13/2021 6:34 AM
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
## 2021-09-13 NOTE — Progress Notes (Signed)
0700 - Report received from PM RN.  All questions answered.  Safety checks performed.  All lines verified. Hand hygiene performed. 0730 - Pt placed on CPAP mode on vent.  Brittainy, HF PA rounded on pt. Dr. Denese Killings rounded on pt. 0800 - Assessment, Rx, and Foley placement d/t urinary retention. 0845 - Pt became tachypneic in the 30s w/SpO2 in the high 80s.  Respiratory notified. Pt placed back on a rate. 0930 - Cardiac monitor exchanged. 1000 - Dr. Denese Killings held a family meeting with pt's family.  Goals of care were discussed. 1110 - [name], [name], decided to make pt comfort care and wants to leave the pt intubated today so family could come and see him and wants to extubate tomorrow around 0800.  Dr. Cooper Render, respiratory, charge nurse, and pharmacy made aware. 1800 - [name], [name], arrived to visit.  All questions answered. 1900 - Report given to PM RN.  All questions answered.

## 2021-09-13 NOTE — Progress Notes (Signed)
eLink Physician-Brief Progress Note Patient Name: Frederick Castaneda DOB: 11-12-1960 MRN: [id number]   Date of Service  09/13/2021  HPI/Events of Note  Urinary retention.  eICU Interventions  Foley catheter protocol ordered.        Thomasene Lot [name] 09/13/2021, 4:46 AM
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
## 2021-09-13 NOTE — Telephone Encounter (Signed)
Rec'd a fax of patient's FMLA forms from [employer] case mgr Isidoro Donning on 12/8.  Marshell Levan sent email to Dr. Chestine Spore and she has agreed to fill out forms when she returns to hospital on 12/16.  I have completed the patient name, claim # and Dr. Ophelia Charter name on the forms and emailed them back to her and Cathlean Cower for completion of the medical info.  Once signed, we will be glad to fax them to [employer] from the [employer] office.

## 2021-09-14 DIAGNOSIS — Z89611 Acquired absence of right leg above knee: Secondary | ICD-10-CM

## 2021-10-02 NOTE — Procedures (Signed)
Extubation Procedure Note  Patient Details:   Name: Frederick Castaneda DOB: Mar 31, 1961 MRN: [id number]   Airway Documentation:    Vent end date: 09/13/2021 Vent end time: 0833   Evaluation  O2 sats: currently acceptable Complications: No apparent complications Patient did tolerate procedure well. Bilateral Breath Sounds: Rhonchi, Diminished   No, pt could not speak post extubation.  Pt extubated to room air per physician's order and in accordance with the family's wishes.  Family at bedside for extubation.  Audrie Lia 09/28/2021, 8:34 AM

## 2021-10-02 NOTE — Discharge Summary (Signed)
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 mild left ventricular hypertrophy. Left ventricular diastolic parameters are consistent with Grade I diastolic dysfunction (impaired relaxation). Right Ventricle: The right ventricular size is normal. Right vetricular wall thickness was not well visualized. Right ventricular systolic function is mildly reduced. Tricuspid regurgitation signal is inadequate for assessing PA pressure. Left Atrium: Left atrial size was normal in size. Right Atrium: Right atrial size was normal in size. Pericardium: There is no evidence of pericardial effusion. Mitral Valve: The mitral valve is normal in structure. Trivial mitral valve regurgitation. Tricuspid Valve: The tricuspid valve is normal in structure. Tricuspid valve regurgitation is trivial. Aortic Valve: The aortic valve was not well visualized. Aortic valve regurgitation is not visualized. No aortic stenosis is present. Aortic valve mean gradient measures 3.0 mmHg. Aortic valve peak gradient measures 6.4 mmHg. Aortic valve area, by VTI measures 2.05 cm. Pulmonic Valve: The pulmonic valve was not well visualized. Pulmonic valve regurgitation is not visualized. Aorta: The aortic root is normal in size and structure. IAS/Shunts: The interatrial septum was not well visualized.  LEFT VENTRICLE PLAX 2D LVIDd:          4.60 cm   Diastology LVIDs:         4.20 cm   LV e' medial:    3.92 cm/s LV PW:         1.50 cm   LV E/e' medial:  8.9 LV IVS:        1.30 cm   LV e' lateral:   5.87 cm/s LVOT diam:     2.30 cm   LV E/e' lateral: 6.0 LV SV:         30 LV SV Index:   15 LVOT Area:     4.15 cm  RIGHT VENTRICLE             IVC RV Basal diam:  3.60 cm     IVC diam: 2.00 cm RV S prime:     10.10 cm/s TAPSE (M-mode): 1.6 cm LEFT ATRIUM             Index        RIGHT ATRIUM           Index LA diam:        3.50 cm 1.74 cm/m   RA Area:     14.10 cm LA Vol (A2C):   58.6 ml 29.11 ml/m  RA Volume:   35.40 ml  17.58 ml/m LA Vol (A4C):   44.4 ml 22.05 ml/m LA Biplane Vol: 51.4 ml 25.53 ml/m  AORTIC VALVE AV Area (Vmax):    1.93 cm AV Area (Vmean):   1.80 cm AV Area (VTI):     2.05 cm AV Vmax:           126.00 cm/s AV Vmean:          84.800 cm/s  AV VTI:            0.148 m AV Peak Grad:      6.4 mmHg AV Mean Grad:      3.0 mmHg LVOT Vmax:         58.60 cm/s LVOT Vmean:        36.700 cm/s LVOT VTI:          0.073 m LVOT/AV VTI ratio: 0.49 MITRAL VALVE MV Area (PHT): 3.65 cm    SHUNTS MV Decel Time: 208 msec    Systemic VTI:  0.07 m MV E velocity: 35.00 cm/s  Systemic Diam: 2.30 cm MV A velocity: 53.60 cm/s MV E/A ratio:  0.65 Epifanio Lesches MD Electronically signed by Epifanio Lesches MD Signature Date/Time: 09/02/2021/9:49:34 AM    Final    ECHOCARDIOGRAM LIMITED  Result Date: 09/09/2021    ECHOCARDIOGRAM LIMITED REPORT   Patient Name:   FREY KUE Date of Exam: 09/09/2021 Medical Rec #:  [id number]        Height:       72.0 in Accession #:    [id number]       Weight:       221.8 lb Date of Birth:  June 07, 1961        BSA:          2.226 m Patient Age:    60 years         BP:           133/69 mmHg Patient Gender: M                HR:           101 bpm. Exam Location:  Inpatient Procedure: 2D Echo, Limited Echo, Color Doppler and Intracardiac Opacification            Agent Indications:    Cardiac arrest  History:         Patient has prior history of Echocardiogram examinations.  Sonographer:    Cleatis Polka Referring Phys: [id number] DANIEL R BENSIMHON IMPRESSIONS  1. Severly reduced LV function, Echodensity in the apex concerning for LV thrombus 1.58 x 1.72 cm. Left ventricular ejection fraction, by estimation, is <20%. The left ventricle has severely decreased function. The left ventricle demonstrates global hypokinesis.  2. Right ventricular systolic function is mildly reduced. The right ventricular size is normal. Conclusion(s)/Recommendation(s): Severe LV hypokinesis. Echodensity in the apex concerning for LV thrombus. LV thrombus size midly reduced compared to echo 09/02/2021. FINDINGS  Left Ventricle: Severly reduced LV function, Echodensity in the apex concerning for LV thrombus 1.58 x 1.72 cm. Left ventricular ejection fraction, by estimation, is <20%. The left ventricle has severely decreased function. The left ventricle demonstrates global hypokinesis. Right Ventricle: The right ventricular size is normal. Right ventricular systolic function is mildly reduced. LEFT VENTRICLE PLAX 2D LVIDd:         4.60 cm LVIDs:         4.40 cm LV PW:         1.10 cm LV IVS:        1.20 cm  LV Volumes (MOD) LV vol d, MOD A2C: 153.0 ml LV vol d, MOD A4C: 184.0 ml LV vol s, MOD A2C: 120.0 ml LV vol s, MOD A4C: 141.0 ml LV SV MOD A2C:     33.0 ml LV SV MOD A4C:     184.0 ml LV SV MOD BP:      40.9 ml IVC IVC diam: 2.10 cm  LEFT ATRIUM         Index LA diam:    4.40 cm 1.98 cm/m  MITRAL VALVE MV Area (PHT): 7.82 cm MV Decel Time: 97 msec MV E velocity: 68.60 cm/s MV A velocity: 34.70 cm/s MV E/A ratio:  1.98 Photographer signed by Carolan Clines Signature Date/Time: 09/09/2021/1:39:53 PM    Final     Microbiology No results found for this or any previous visit (from the past 240 hour(s)).  Lab Basic Metabolic Panel: No results for input(s): NA, K, CL, CO2, GLUCOSE, BUN, CREATININE, CALCIUM, MG, PHOS in the last 168 hours. Liver  Function Tests: No results for input(s): AST, ALT, ALKPHOS, BILITOT, PROT, ALBUMIN in the last 168 hours. No results for input(s): LIPASE, AMYLASE in the last 168 hours. No results for input(s): AMMONIA in the last 168 hours. CBC: No results for input(s): WBC, NEUTROABS, HGB, HCT, MCV, PLT in the last 168 hours. Cardiac Enzymes: No results for input(s): CKTOTAL, CKMB, CKMBINDEX, TROPONINI in the last 168 hours. Sepsis Labs: No results for input(s): PROCALCITON, WBC, LATICACIDVEN in the last 168 hours.  Procedures/Operations  Mechanical ventilation.  Right leg thrombectomy and 4 compartment fasciotomy.  Right AKA.   Achaia Garlock 09/20/2021, 9:52 AM

## 2021-10-02 NOTE — Progress Notes (Signed)
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  Cari Caraway, MD 6:18 AM

## 2021-10-02 NOTE — Progress Notes (Signed)
°  Discussed with CCM and bedside RN. Transitioning to comfort care today with terminal extubation.   Comfort meds adjusted.   HF team will sign off.   Arvilla Meres, MD  8:20 AM

## 2021-10-02 NOTE — Progress Notes (Signed)
Nutrition Brief Note ? ?Chart reviewed. ?Pt now transitioning to comfort care.  ?No further nutrition interventions planned at this time.  ?Please re-consult as needed.  ? ? Mose Colaizzi MS, RDN, LDN, CNSC ?Registered Dietitian III ?Clinical Nutrition ?RD Pager and On-Call Pager Number Located in Amion  ? ? ?
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
## 2021-10-02 DEATH — deceased

## 2021-10-31 ENCOUNTER — Telehealth: Payer: Self-pay | Admitting: Critical Care Medicine
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
## 2021-12-26 ENCOUNTER — Telehealth: Payer: Self-pay | Admitting: Critical Care Medicine
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
## 2022-11-11 IMAGING — DX DG ABDOMEN 1V
1 series · 1 of 1 positions shown · non-contrast
Comparison: 10/05/2020

CLINICAL DATA: Ileus.  Abdominal distension

EXAM:
ABDOMEN - 1 VIEW

[abdomen]
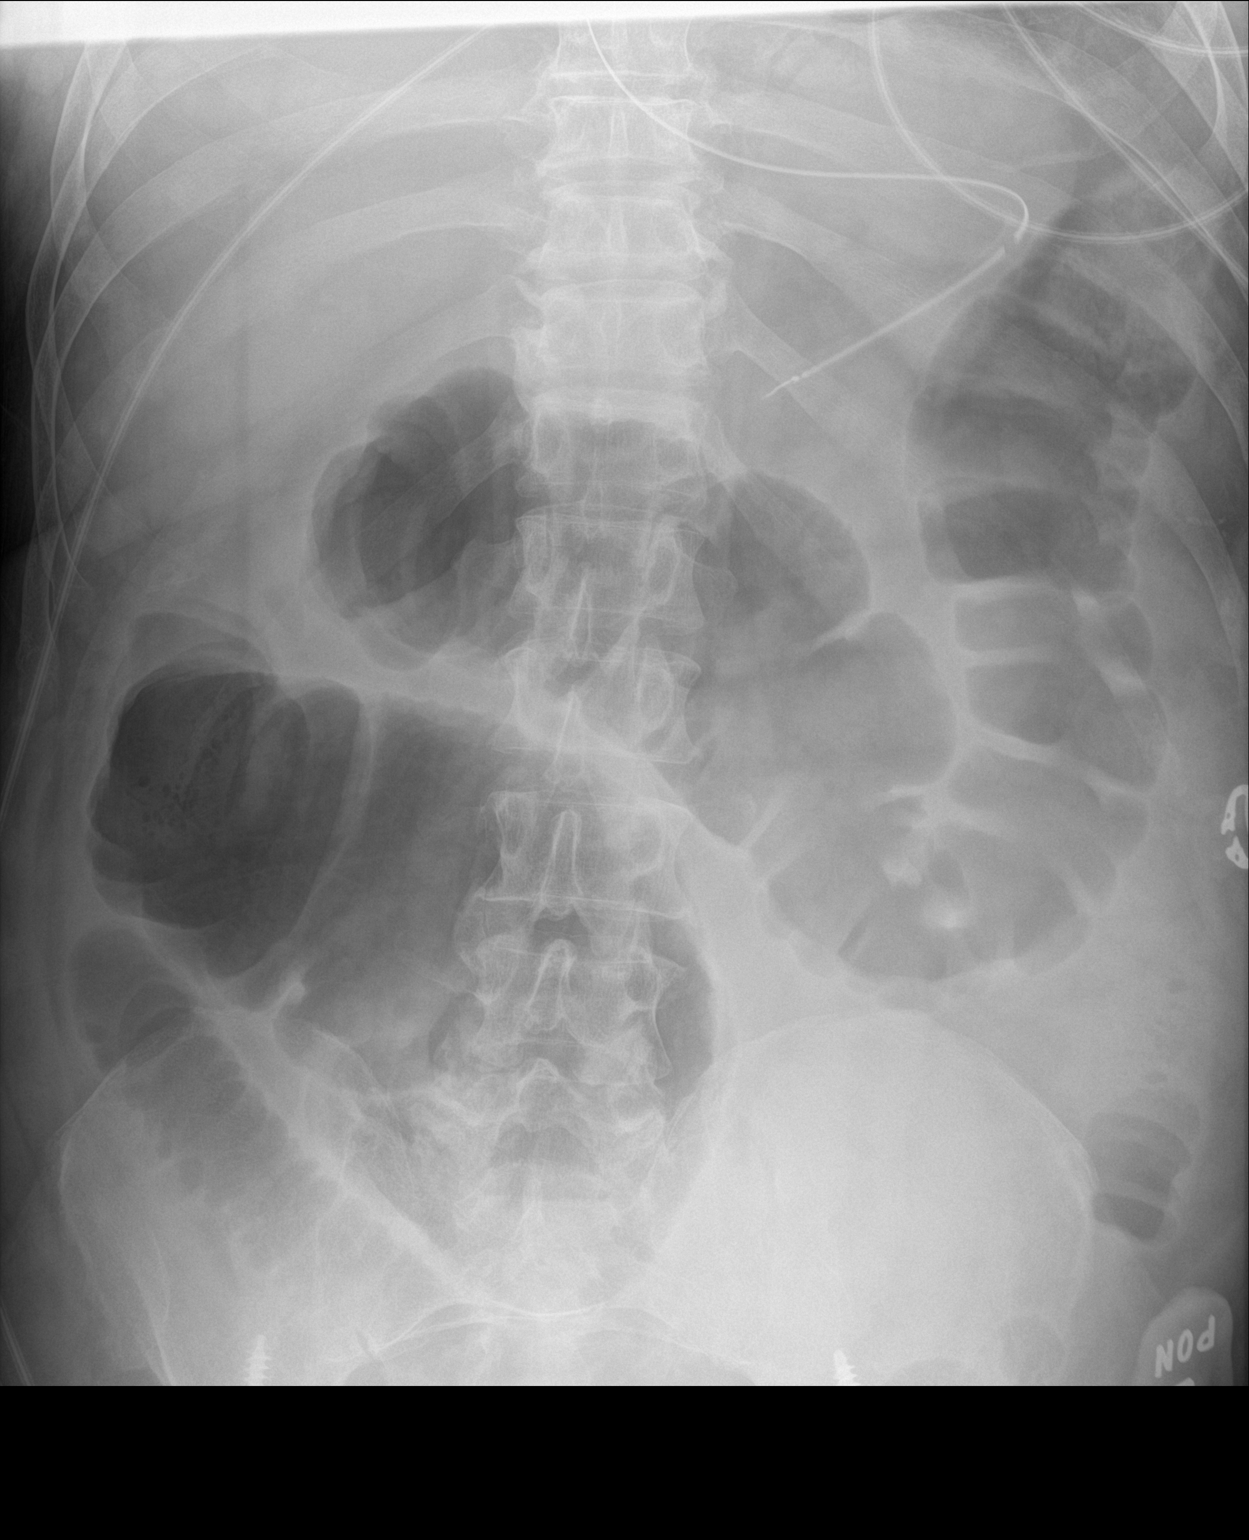

[1 of 1 positions shown; findings below may reference images not displayed]

FINDINGS: Mild colonic distension.  Mild small bowel distension.

NG tube in the stomach.
IMPRESSION: Mild colonic and small bowel distension compatible with ileus. NG
tube in the stomach.

## 2022-11-11 IMAGING — DX DG CHEST 1V PORT
1 series · 1 of 1 positions shown · non-contrast
Comparison: September 08, 2021.

CLINICAL DATA: Endotracheal tube.

EXAM:
PORTABLE CHEST 1 VIEW

[chest]
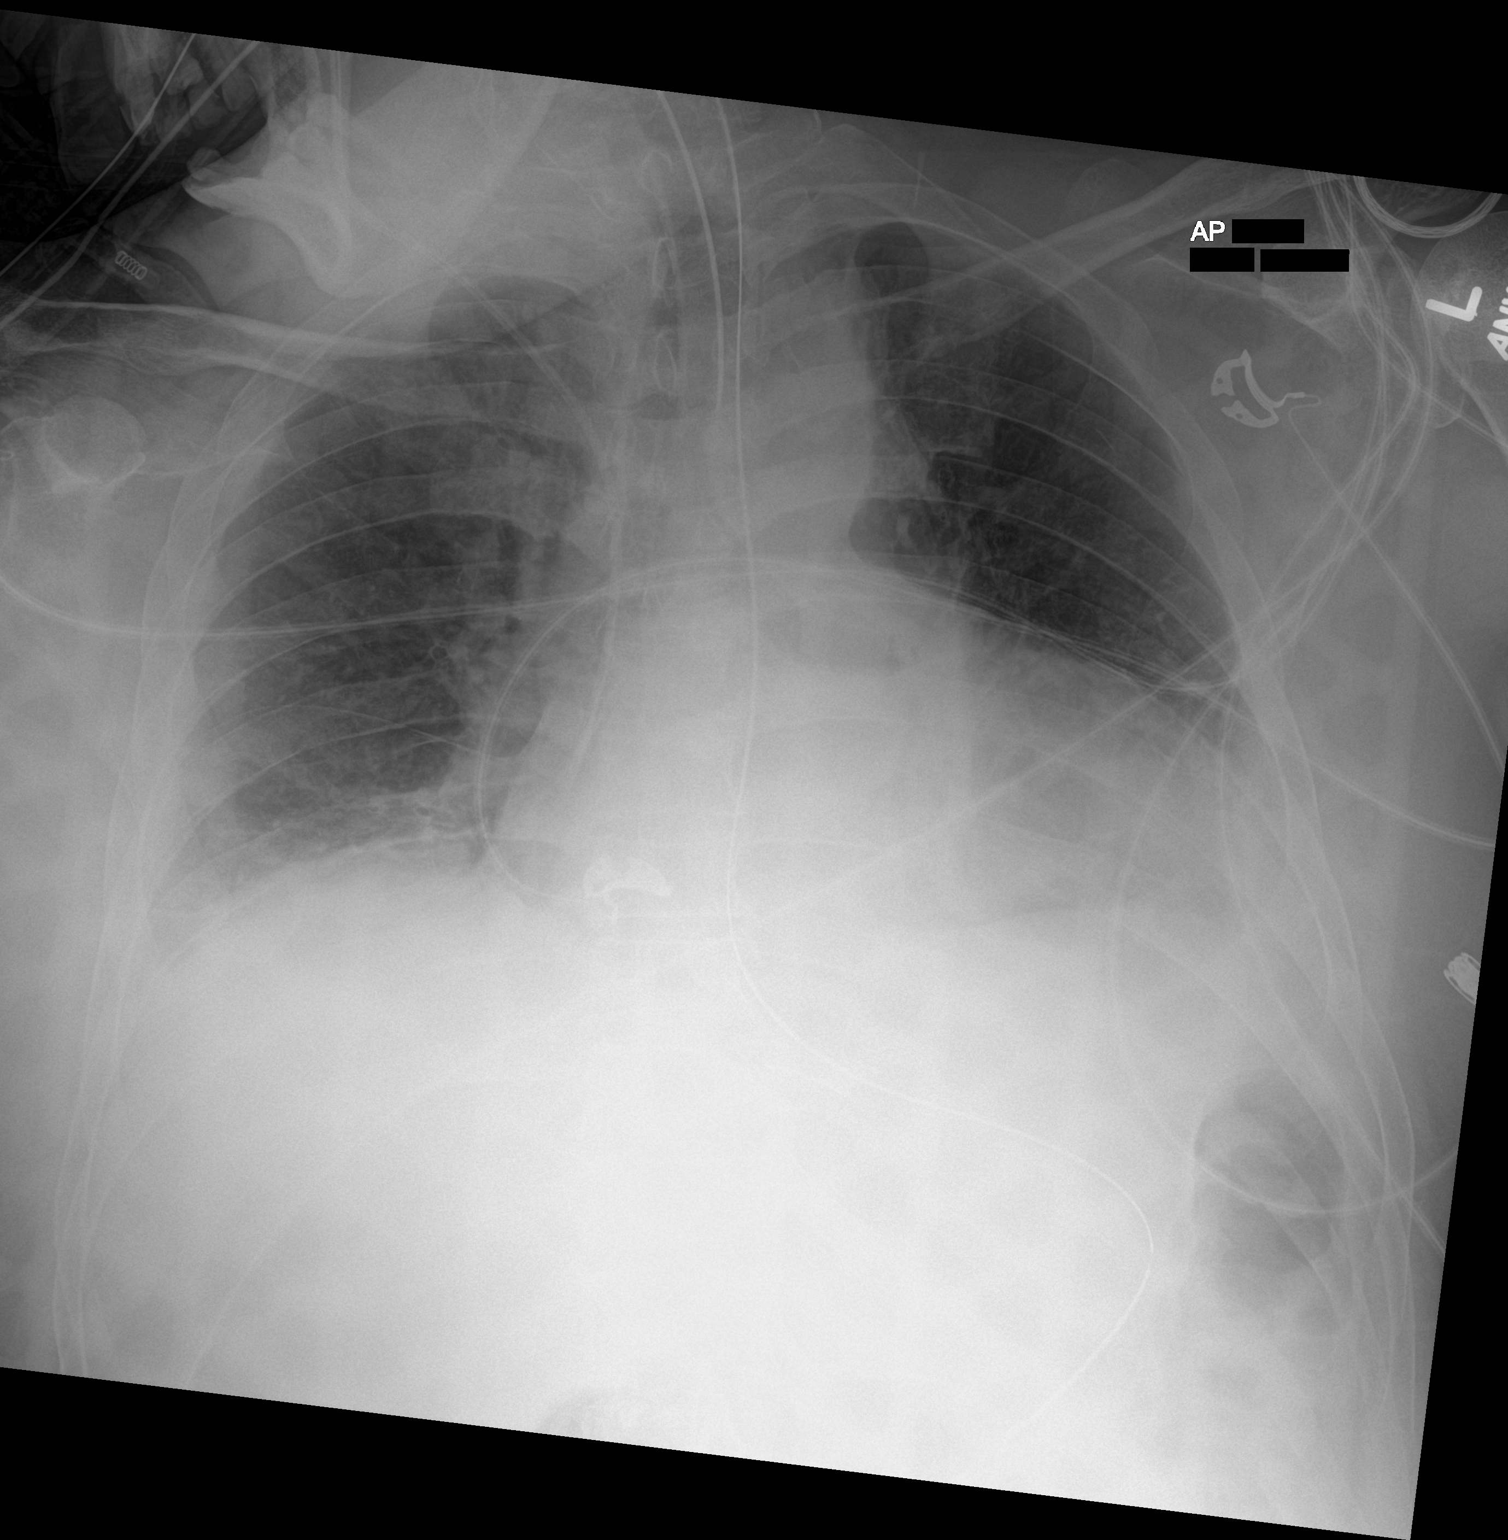

[1 of 1 positions shown; findings below may reference images not displayed]

FINDINGS: Stable cardiomediastinal silhouette. Endotracheal and nasogastric
tubes are unchanged in position. Right internal jugular catheter is
unchanged. Mild bibasilar subsegmental atelectasis is noted. Bony
thorax is unremarkable.
IMPRESSION: Stable support apparatus.  Mild bibasilar subsegmental atelectasis.
